# Patient Record
Sex: Male | Born: 1960 | Race: White | Marital: Single | Smoking: Current some day smoker
Health system: Northeastern US, Academic
[De-identification: ages and names within clinical notes are randomized; demographics above are authoritative.]

## PROBLEM LIST (undated history)

## (undated) HISTORY — PX: LIPOMA RESECTION: SHX23

---

## 2011-10-25 ENCOUNTER — Emergency Department: Admit: 2011-10-25 | Disposition: A | Discharge status: Home / Self Care | Payer: Self-pay | Source: Ambulatory Visit

## 2011-10-25 ENCOUNTER — Encounter: Payer: Self-pay | Admitting: Surgical Oncology

## 2011-10-25 LAB — COMPREHENSIVE METABOLIC PANEL
ALT: 23 U/L (ref 0–50)
AST: 18 U/L (ref 0–50)
Albumin: 4.2 g/dL (ref 3.5–5.2)
Alk Phos: 61 U/L (ref 40–130)
Anion Gap: 10 (ref 7–16)
Bilirubin,Total: 0.5 mg/dL (ref 0.0–1.2)
CO2: 25 mmol/L (ref 20–28)
Calcium: 8.7 mg/dL (ref 8.6–10.2)
Chloride: 103 mmol/L (ref 96–108)
Creatinine: 0.74 mg/dL (ref 0.67–1.17)
GFR,Black: 124 *
GFR,Caucasian: 107 *
Globulin: 2.4 g/dL — ABNORMAL LOW (ref 2.7–4.3)
Glucose: 99 mg/dL (ref 60–99)
Lab: 19 mg/dL (ref 6–20)
Potassium: 4 mmol/L (ref 3.3–5.1)
Sodium: 138 mmol/L (ref 133–145)
Total Protein: 6.6 g/dL (ref 6.3–7.7)

## 2011-10-25 LAB — CBC AND DIFFERENTIAL
Baso # K/uL: 0 10*3/uL (ref 0.0–0.1)
Basophil %: 0.2 % (ref 0.2–1.2)
Eos # K/uL: 0.1 10*3/uL (ref 0.0–0.5)
Eosinophil %: 0.7 % — ABNORMAL LOW (ref 0.8–7.0)
Hematocrit: 41 % (ref 40–51)
Hemoglobin: 13.9 g/dL (ref 13.7–17.5)
Lymph # K/uL: 1.8 10*3/uL (ref 1.3–3.6)
Lymphocyte %: 14.1 % — ABNORMAL LOW (ref 21.8–53.1)
MCV: 92 fL (ref 79–92)
Mono # K/uL: 1 10*3/uL — ABNORMAL HIGH (ref 0.3–0.8)
Monocyte %: 7.5 % (ref 5.3–12.2)
Neut # K/uL: 9.9 10*3/uL — ABNORMAL HIGH (ref 1.8–5.4)
Platelets: 235 10*3/uL (ref 150–330)
RBC: 4.4 MIL/uL — ABNORMAL LOW (ref 4.6–6.1)
RDW: 12.5 % (ref 11.6–14.4)
Seg Neut %: 77.2 % — ABNORMAL HIGH (ref 34.0–67.9)
WBC: 12.9 10*3/uL — ABNORMAL HIGH (ref 4.2–9.1)

## 2011-10-25 MED ORDER — CIPROFLOXACIN HCL 500 MG PO TABS *I*
500.0000 mg | ORAL_TABLET | Freq: Once | ORAL | Status: AC
Start: 2011-10-26 — End: 2011-10-26
  Administered 2011-10-26: 500 mg via ORAL
  Filled 2011-10-25: qty 1

## 2011-10-25 MED ORDER — METRONIDAZOLE 250 MG PO TABS *I*
500.0000 mg | ORAL_TABLET | Freq: Once | ORAL | Status: AC
Start: 2011-10-26 — End: 2011-10-26
  Administered 2011-10-26: 500 mg via ORAL
  Filled 2011-10-25: qty 2

## 2011-10-25 MED ORDER — CIPROFLOXACIN HCL 500 MG PO TABS *I*
500.0000 mg | ORAL_TABLET | Freq: Two times a day (BID) | ORAL | Status: AC
Start: 2011-10-25 — End: 2011-11-01

## 2011-10-25 MED ORDER — METRONIDAZOLE 500 MG PO TABS *I*
500.0000 mg | ORAL_TABLET | Freq: Three times a day (TID) | ORAL | Status: AC
Start: 2011-10-25 — End: 2011-11-04

## 2011-10-25 MED ORDER — ONDANSETRON HCL 2 MG/ML IV SOLN *I*
4.0000 mg | Freq: Once | INTRAMUSCULAR | Status: AC
Start: 2011-10-25 — End: 2011-10-25
  Administered 2011-10-25: 4 mg via INTRAVENOUS
  Filled 2011-10-25: qty 2

## 2011-10-25 MED ORDER — HYDROMORPHONE HCL PF 1 MG/ML IJ SOLN *WRAPPED*
1.0000 mg | Freq: Once | INTRAMUSCULAR | Status: AC
Start: 2011-10-25 — End: 2011-10-25
  Administered 2011-10-25: 1 mg via INTRAVENOUS
  Filled 2011-10-25: qty 1

## 2011-10-25 MED ORDER — ONDANSETRON 4 MG PO TBDP *I*
4.0000 mg | ORAL_TABLET | Freq: Three times a day (TID) | ORAL | Status: DC | PRN
Start: 2011-10-25 — End: 2012-04-23

## 2011-10-25 MED ORDER — SODIUM CHLORIDE 0.9 % IV SOLN WRAPPED *I*
125.0000 mL/h | Freq: Once | Status: AC
Start: 2011-10-25 — End: 2011-10-25
  Administered 2011-10-25: 125 mL/h via INTRAVENOUS

## 2011-10-25 MED ORDER — HYDROCODONE-ACETAMINOPHEN 5-325 MG PO TABS *I*
1.0000 | ORAL_TABLET | Freq: Four times a day (QID) | ORAL | Status: AC | PRN
Start: 2011-10-25 — End: 2011-11-24

## 2011-10-25 NOTE — ED Notes (Signed)
Pt started on oral contrast, will monitor tolerance

## 2011-10-25 NOTE — Discharge Instructions (Signed)
Please establish a primary care physician. Please call the new patient phone line at [facility] at [phone].   Take Cipro and flagyl as directed.   Take Zofran for nausea  Take Norco for uncontrolled pain.   Return to the Emergency Department if your condition worsens.

## 2011-10-25 NOTE — ED Provider Notes (Signed)
History     Chief Complaint   Patient presents with   . Abdominal Pain     pt c/o left lower back and lower abdominal pain since monday, with nausea, fatigue, and lightheaded     HPI Comments: 51 yo male presents to the ER with abdominal pain x 4 days. States pain originally started in his lower back and now is mostly in his abdomen. States pain is worse with movement.  + nausea. No fevers or chills.  Denies any hx of intraabdominal pathology such as, IBS, crohn's, colitis, diverticulitis. Has poor appetite from the pain. Denies any changes in his BMs.    The history is provided by the patient.       History reviewed. No pertinent past medical history.         History reviewed. No pertinent past surgical history.    History reviewed. No pertinent family history.      Social History      reports that he has never smoked. He does not have any smokeless tobacco history on file. His alcohol, drug, and sexual activity histories not on file.    Living Situation    Questions Responses    Patient lives with     Homeless     Caregiver for other family member     External Services     Employment Employed    Domestic Violence Risk           Review of Systems   Review of Systems   Constitutional: Positive for appetite change and fatigue. Negative for fever and chills.   Respiratory: Negative.    Cardiovascular: Negative.    Gastrointestinal: Positive for nausea and abdominal pain. Negative for vomiting and diarrhea.   Skin: Negative.    All other systems reviewed and are negative.        Physical Exam     ED Triage Vitals   BP Heart Rate Resp Temp Temp src SpO2 O2 Device O2 Flow Rate weight   10/25/11 1923 10/25/11 1923 10/25/11 1923 10/25/11 1923 -- 10/25/11 1923 10/25/11 1923 -- 10/25/11 1923   146/98 mmHg 82  16  37.6 C (99.7 F)  97 % None (Room air)  72.576 kg (160 lb)       Physical Exam   Nursing note and vitals reviewed.  Constitutional: He is oriented to person, place, and time. He appears well-developed and  well-nourished.   Cardiovascular: Normal rate, regular rhythm and normal heart sounds.    Pulmonary/Chest: Effort normal.   Abdominal: Soft. Bowel sounds are normal. There is tenderness (LLQ pain with palpation. No palpable masses.  no distension.). There is rebound.   Musculoskeletal: Normal range of motion.   Neurological: He is alert and oriented to person, place, and time.   Psychiatric: He has a normal mood and affect.       Medical Decision Making      Amount and/or Complexity of Data Reviewed  Clinical lab tests: ordered  Tests in the radiology section of CPT: ordered  Tests in the medicine section of CPT: ordered  Review and summarize past medical records: yes        Initial Evaluation:  ED First Provider Contact    Date/Time Event User Comments    10/25/11 2106 ED Provider First Contact MCGRUDER, Marquail Bradwell L Initial Face to Face Provider Contact          Patient seen by me as above    Assessment:  51 y.o., male  comes to the ED with abdominal pain x 4 days. Nausea.     Differential Diagnosis includes diverticulitis, colitis, kidney stone.             Plan: UA, CBC, CMP  CT abdomen and pelvis with contrast.  Reassess.    Patient will be transferred to the main ED.       Dub Amis, NP    McGruder, Murlean Iba, NP  10/26/11 1242

## 2011-10-26 NOTE — ED Provider Progress Notes (Signed)
ED Provider Progress Note     Pt signed out by Denny Peon, NP.     Ct scan reviewed:  CT ABDOMEN & PELVIS WITH CONTRAST    (Results Pending)   Diverticulitis seen.     Pt stable, comfortable. Cipro and flagyl ordered.     Pt will be d/c home with PO cipro, flagyl, norco, zofran.     Gilford Rile, PA, 10/26/2011, 12:33 AM

## 2011-11-13 ENCOUNTER — Encounter: Payer: Self-pay | Admitting: Family Medicine

## 2011-11-13 ENCOUNTER — Ambulatory Visit: Payer: Self-pay | Admitting: Family Medicine

## 2011-11-13 VITALS — BP 130/84 | HR 80 | Temp 98.6°F | Ht 64.5 in | Wt 153.0 lb

## 2011-11-13 DIAGNOSIS — Z716 Tobacco abuse counseling: Secondary | ICD-10-CM

## 2011-11-13 DIAGNOSIS — M549 Dorsalgia, unspecified: Secondary | ICD-10-CM

## 2011-11-13 DIAGNOSIS — K5792 Diverticulitis of intestine, part unspecified, without perforation or abscess without bleeding: Secondary | ICD-10-CM | POA: Insufficient documentation

## 2011-11-13 DIAGNOSIS — Z789 Other specified health status: Secondary | ICD-10-CM

## 2011-11-13 DIAGNOSIS — R9389 Abnormal findings on diagnostic imaging of other specified body structures: Secondary | ICD-10-CM

## 2011-11-13 DIAGNOSIS — F17200 Nicotine dependence, unspecified, uncomplicated: Secondary | ICD-10-CM

## 2011-11-13 MED ORDER — NICOTINE POLACRILEX 4 MG MT GUM *I*
4.0000 mg | CHEWING_GUM | OROMUCOSAL | Status: DC | PRN
Start: 2011-11-13 — End: 2012-04-23

## 2011-11-13 NOTE — Progress Notes (Signed)
[facility] - Outpatient Progress Note    SUBJECTIVE    CC: Back pain    HPI:    Back pain started around the time of his diverticulitis, but also increased golf playing. Gradually improving. Wraps around to front, tender to touch. Better w/ heat ice nsaid, worse w/ standing.    Metamucil daily, stools softer. Going 1-2 times per day. Improving diet. Appetite is good. No pain in abdomen since finishing abx.    Trying to quit. Has tried to quit in past. Tried chantix with poor results. Has not tried the patch or the gum. Partner also wants to quit. Interested in the gum.     Drinks 3-4 beers/night. No issues with behavior or other problems or symptoms.        History:      1. Back pain    2. Diverticulitis    3. Abnormal finding on imaging    4. Encounter for smoking cessation counseling        ROS: as per HPI    Patient's medications, allergies, past medical, surgical, social and family histories were reviewed and updated as appropriate.    OBJECTIVE    Filed Vitals:    11/13/11 1500   BP: 130/84   Pulse: 80   Temp: 37 C (98.6 F)   Height: 1.638 m (5' 4.5")   Weight: 69.4 kg (153 lb)       General: Oriented to person, place, and time. Appears well-developed and well-nourished. No distress.   Head: Normocephalic and atraumatic.   Eyes: Conjunctivae and EOM are normal. Pupils are equal, round, and reactive to light.   Neck: Normal range of motion. Neck supple. No thyromegaly present.   Cardiovascular: Normal rate, regular rhythm, normal heart sounds and intact distal pulses.  Exam reveals no gallop and no friction rub.    No murmur heard.  Pulmonary/Chest: Effort normal and breath sounds normal.   Abdominal: Soft. Bowel sounds are normal. There is no tenderness.   Neurological: No gross cranial nerve deficit.   BACK: Nontender to palpation of spine, mild tenderness along obliques c/w sprain  Skin: Skin is warm and dry.   Psychiatric: Normal mood and affect. Behavior is normal. Judgment and thought  content normal.     ASSESSMENT & PLAN    Back pain  A: h/p c/w abdominal muscle sprain.  P: Reassured. Continue supportive care.    Diverticulitis  A: Improved greatly with abx treatment. Taking fiber regularly.   P: to gradually increase fiber intake, will use plenty H2O        1. Back pain     2. Diverticulitis     3. Abnormal finding on imaging  US renal retroperitoneal complete, US abdomen limited single quad, US doppler abdomen complete   4. Encounter for smoking cessation counseling  nicotine polacrilex (NICORETTE) 4 MG gum   5. Smoking addiction     6. Alcohol consumption heavy         Rondel Baton, MD  3:46 PM  11/13/2011      -------------------------------------    Family Medicine Attestation    I have reviewed the history, exam findings, and plan of care and discussed the care plan with the resident at the time of the visit. I agree with the resident's findings and plan of care as documented above.    Silvio Pate, MD  Family Medicine Attending Physician    Signed at 6:02 PM on 11/13/2011.

## 2011-11-13 NOTE — Assessment & Plan Note (Signed)
A: h/p c/w abdominal muscle sprain.  P: Reassured. Continue supportive care.

## 2011-11-13 NOTE — Assessment & Plan Note (Signed)
A: Improved greatly with abx treatment. Taking fiber regularly.   P: to gradually increase fiber intake, will use plenty H2O

## 2011-11-17 DIAGNOSIS — Z0000 Encounter for general adult medical examination without abnormal findings: Secondary | ICD-10-CM

## 2012-04-23 ENCOUNTER — Ambulatory Visit: Payer: Self-pay | Admitting: Family Medicine

## 2012-04-23 ENCOUNTER — Encounter: Payer: Self-pay | Admitting: Family Medicine

## 2012-04-23 ENCOUNTER — Encounter: Payer: Self-pay | Admitting: Gastroenterology

## 2012-04-23 VITALS — BP 144/83 | HR 60 | Temp 97.9°F | Ht 65.14 in | Wt 159.0 lb

## 2012-04-23 DIAGNOSIS — Z0000 Encounter for general adult medical examination without abnormal findings: Secondary | ICD-10-CM

## 2012-04-23 DIAGNOSIS — Z021 Encounter for pre-employment examination: Secondary | ICD-10-CM

## 2012-04-23 DIAGNOSIS — R059 Cough, unspecified: Secondary | ICD-10-CM

## 2012-04-23 LAB — POCT URINALYSIS DIPSTICK
Blood,UA POCT: NEGATIVE
Glucose,UA POCT: NORMAL
Ketones,UA POCT: NEGATIVE
Leuk Esterase,UA POCT: NEGATIVE
Lot #: 22090401
Nitrite,UA POCT: NEGATIVE
PH,UA POCT: 8 (ref 5–8)
Protein,UA POCT: NEGATIVE mg/dL
Specific gravity,UA POCT: 1.01 (ref 1.002–1.03)

## 2012-04-23 NOTE — Progress Notes (Signed)
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   Excited for this opportunity. Also works Holiday representative, just resided a house, replaced windows and roof.     --PMH  Denies  --PSH    Denies  FAMILY HX:  --Mother: alive, no health problems.   --Father: deceased: heavy smoker, ? Lung CA  --Siblings: sister: good health, brother: drug addiction, mental health issues.   --cancers: Dad with lung cancer. No other's that he is aware of.   --DM/Thyroid: denies  --CAD: denies  --Renal: denies  --hematology:denies  --Mental Health: brother with mental illness.      SOCIAL HX:  --Marital status: divorced. Living with girlfriend of 2 years.   --Cohabitants: girl friend and her 2 children  --Children: 2 grown children.   --Education:HS. Some college,    --Recent life stressors: denies  --exercise ; explain:  Works out and job is physical  --Tobacco:   Quit 6 months ago!   --Smoke in home: denies  --Alcohol: y about a 12 pack a week.   --Support systems/coping skills:  good     SEXUAL HISTORY:   --Opposite sex: monogamous.   --# of partners in last  2 years: 1   --STD history: denies  --Contraceptive method: condom/btl     SAFETY:  --wears seatbelt Y  --smoke detectors present Y  --uses sunscreen Y  --housing is adequate Y  --history of domestic violence N  --occupation and risks: construction: lacerations, contusions, trauma.      CONSTITUTIONAL: Appetite good, no fevers, night sweats or weight loss  CV: No chest pain, shortness of breath or peripheral edema  RESPIRATORY: No, wheezing or dyspnea. Occasional cough.    GI: No nausea/vomiting, abdominal pain, or change in bowel habits  GU: No dysuria, urgency or incontinence  NEURO: No MS changes, no motor weakness, no sensory changes     GENERAL APPEARANCE: Appears stated age, well appearing, NAD  HEENT: PERRL, EOMI, TMs normal, oropharynx clear  NECK:Supple, no  lymphadenopathy, thyroid symmetrical with no lesions or nodules apprecaited.   LUNGS: Clear to auscultation and percussion  HEART: Normal S1,S2 without murmurs, gallops, or rubs; PMI not displaced  ABDOMEN: NABS, soft, non-tender, without hepato-splenomegaly  EXTREMITIES: Without clubbing, cyanosis, or edema  NEUROLOGIC: Alert and oriented x3, cranial nerves II-XII intact, motor/sensory exam normal, DTRs symmetric, normal gait     ASSESSMENT:    --Well -- 51 year old  --Diagnoses:    --Abnormal imaging: seen in June/july for follow up ED visit: advised to f/u with [place] for liver lesions: has not had done: states he will have done when he gets back from cruise.   --Preventative: discussed colonoscopy: declines at this time: leaving for 4 month cruise 05/15/2011. Will f/u when he returns.  Will have cxray done for occassional cough and father's history of lung ca as well as his own tobacco history.      PLAN:     1 to 3 years.   --Immunizations: tetanus and Pneumovax today.   Labs:    Needs updated cbc, cmp, MMR titer, Hep A and B titers, HIV and RPR for new job as well as annual health screening.   EKG with sinus brady. Athletic in and out of work.      Other:  anticipatory guidance: safe sexual practices, accident prevention, heart healthy habits.     Form started. Will complete when  labs returned.   Patient keeping form and to bring back in when labs/CKRay  completed

## 2012-04-24 ENCOUNTER — Ambulatory Visit
Admit: 2012-04-24 | Discharge: 2012-04-24 | Disposition: A | Discharge status: Home / Self Care | Payer: Self-pay | Source: Ambulatory Visit | Attending: Family Medicine | Admitting: Family Medicine

## 2012-04-24 ENCOUNTER — Ambulatory Visit
Admit: 2012-04-24 | Discharge: 2012-04-24 | Disposition: A | Discharge status: Home / Self Care | Payer: Self-pay | Source: Ambulatory Visit | Attending: Primary Care | Admitting: Primary Care

## 2012-04-24 DIAGNOSIS — Z0000 Encounter for general adult medical examination without abnormal findings: Secondary | ICD-10-CM

## 2012-04-24 LAB — COMPREHENSIVE METABOLIC PANEL
ALT: 32 U/L (ref 0–50)
AST: 27 U/L (ref 0–50)
Albumin: 5 g/dL (ref 3.5–5.2)
Alk Phos: 67 U/L (ref 40–130)
Anion Gap: 10 (ref 7–16)
Bilirubin,Total: 0.9 mg/dL (ref 0.0–1.2)
CO2: 30 mmol/L — ABNORMAL HIGH (ref 20–28)
Calcium: 9.1 mg/dL (ref 8.6–10.2)
Chloride: 100 mmol/L (ref 96–108)
Creatinine: 0.83 mg/dL (ref 0.67–1.17)
GFR,Black: 117 *
GFR,Caucasian: 102 *
Globulin: 2.6 g/dL — ABNORMAL LOW (ref 2.7–4.3)
Glucose: 99 mg/dL (ref 60–99)
Lab: 15 mg/dL (ref 6–20)
Potassium: 4.5 mmol/L (ref 3.3–5.1)
Sodium: 140 mmol/L (ref 133–145)
Total Protein: 7.6 g/dL (ref 6.3–7.7)

## 2012-04-24 LAB — CBC
Hematocrit: 45 % (ref 40–51)
Hemoglobin: 15.4 g/dL (ref 13.7–17.5)
MCV: 93 fL — ABNORMAL HIGH (ref 79–92)
Platelets: 301 10*3/uL (ref 150–330)
RBC: 4.9 MIL/uL (ref 4.6–6.1)
RDW: 12.7 % (ref 11.6–14.4)
WBC: 9.9 10*3/uL — ABNORMAL HIGH (ref 4.2–9.1)

## 2012-04-24 LAB — LIPID PANEL
Chol/HDL Ratio: 4.3 (ref 0.0–5.0)
Cholesterol: 269 mg/dL — AB
HDL: 63 mg/dL
LDL Calculated: 145 mg/dL
Non HDL Cholesterol: 206 mg/dL
Triglycerides: 305 mg/dL — AB

## 2012-04-25 LAB — HEPATITIS B SURFACE ANTIGEN: HBV S Ag: NEGATIVE

## 2012-04-25 LAB — SYPHILIS SCREEN
Syphilis Screen: NEGATIVE
Syphilis Status: NONREACTIVE

## 2012-04-25 LAB — HIV 1&2 ANTIGEN/ANTIBODY: HIV 1&2 ANTIGEN/ANTIBODY: NONREACTIVE

## 2012-04-25 LAB — MEASLES IGG AB: Measles IgG: POSITIVE

## 2012-04-25 LAB — HEPATITIS A ANTIBODY, IGM: Hep A IgM: NEGATIVE

## 2012-04-26 ENCOUNTER — Ambulatory Visit: Payer: Self-pay

## 2012-04-26 ENCOUNTER — Other Ambulatory Visit: Payer: Self-pay | Admitting: Family Medicine

## 2012-04-26 DIAGNOSIS — Z0000 Encounter for general adult medical examination without abnormal findings: Secondary | ICD-10-CM

## 2012-04-26 LAB — MUMPS ANTIBODY, IGG: Mumps IgG: POSITIVE

## 2012-04-26 LAB — RUBELLA ANTIBODY, IGG: Rubella IgG AB: POSITIVE

## 2013-06-04 ENCOUNTER — Emergency Department: Admit: 2013-06-04 | Disposition: A | Discharge status: Home / Self Care | Payer: Self-pay | Source: Ambulatory Visit

## 2013-06-04 ENCOUNTER — Encounter: Payer: Self-pay | Admitting: Pulmonary Disease

## 2013-06-04 MED ORDER — ONDANSETRON 4 MG PO TBDP *I*
4.0000 mg | ORAL_TABLET | Freq: Once | ORAL | Status: AC
Start: 2013-06-04 — End: 2013-06-04
  Administered 2013-06-04: 4 mg via ORAL
  Filled 2013-06-04: qty 1

## 2013-06-04 MED ORDER — CEPHALEXIN 500 MG PO CAPS *I*
500.0000 mg | ORAL_CAPSULE | Freq: Four times a day (QID) | ORAL | Status: AC
Start: 2013-06-04 — End: 2013-06-11

## 2013-06-04 MED ORDER — OXYCODONE-ACETAMINOPHEN 2.5-325 MG PO TABS *A*
1.0000 | ORAL_TABLET | Freq: Four times a day (QID) | ORAL | Status: DC | PRN
Start: 2013-06-04 — End: 2015-11-22

## 2013-06-04 MED ORDER — OXYCODONE-ACETAMINOPHEN 5-325 MG PO TABS *I*
2.0000 | ORAL_TABLET | Freq: Once | ORAL | Status: AC
Start: 2013-06-04 — End: 2013-06-04
  Administered 2013-06-04: 2 via ORAL
  Filled 2013-06-04: qty 2

## 2013-06-04 MED ORDER — CEPHALEXIN 250 MG PO CAPS *I*
500.0000 mg | ORAL_CAPSULE | Freq: Once | ORAL | Status: AC
Start: 2013-06-04 — End: 2013-06-04
  Administered 2013-06-04: 500 mg via ORAL
  Filled 2013-06-04: qty 2

## 2013-06-04 NOTE — ED Procedure Documentation (Signed)
Procedures   Laceration repair  Date/Time: 06/04/2013 10:00 AM  Performed by: Jackquline Berlin  Authorized by: Jackquline Berlin  Consent: Verbal consent obtained.  Risks and benefits: risks, benefits and alternatives were discussed  Consent given by: patient  Patient understanding: patient states understanding of the procedure being performed  Patient consent: the patient's understanding of the procedure matches consent given  Procedure consent: procedure consent matches procedure scheduled  Required items: required blood products, implants, devices, and special equipment available  Patient identity confirmed: verbally with patient  Body area: lower extremity  Location details: left upper leg  Laceration length: 8 cm  Contamination: The wound is contaminated. (shreds of denim and shards of metal)  Tendon involvement: none  Nerve involvement: none  Vascular damage: no  Anesthesia: local infiltration  Local anesthetic: lidocaine 1% with epinephrine  Anesthetic total: 10 ml  Patient sedated: no  Preparation: Patient was prepped and draped in the usual sterile fashion.  Irrigation solution: saline  Irrigation method: syringe  Amount of cleaning: extensive  Skin closure: staples  Number of sutures: 8 (staples)  Approximation: close  Dressing: 4x4 sterile gauze and gauze roll  Patient tolerance: Patient tolerated the procedure well with no immediate complications.  Comments: Wrapped with ace wrap.         Jackquline Berlin, PA

## 2013-06-04 NOTE — ED Notes (Signed)
Pt states he cut his L upper leg with grinding wheel. Bleeding controlled at triage  Last TD last year

## 2013-06-04 NOTE — Discharge Instructions (Signed)
Please take full course of antibiotic(s) (keflex) as directed. You received your first dose of this medication in the Emergency Department, so start this medication in 6 hours  Please use percocet for severe pain. Use as prescribed. Do not drink alcohol, drive, or operate machinery while on this medication. Do not take additional Tylenol or products containing acetaminophen while on this medication. Take with food to prevent stomach upset/nausea/vomiting.  Please leave bandage in place for next 24-48 hours. After that, remove the bandage. Clean wound daily with soap/water. Pat dry gently. Keep wound covered if still oozing blood. Keep wound covered if likely to get dirty. Do not immerse wound in water for prolonged periods (i.e. swimming, bath, etc.) until completely healed. Follow up with your primary care physician in 2 days for wound check and in 10-14 days for staple removal. If you do not have a primary care physician, you can return to the Emergency Department for staple removal  Watch for signs of infection: increased redness, pain, pus, swelling, fever and seek care if noted

## 2013-06-04 NOTE — ED Provider Notes (Signed)
History     Chief Complaint   Patient presents with    Leg Injury     HPI Comments: Laceration    Cut left thigh about 1 hour prior to arrival on a grinding wheel. Bleeding controlled. Tetanus up to date. Able to ambulate without difficulty.  No weakness, numbness, tingling. Denies other injury. Did not get a chance to wash wound prior to arrival.      History provided by:  Patient      History reviewed. No pertinent past medical history.         Past Surgical History   Procedure Laterality Date    Lipoma resection         Family History   Problem Relation Age of Onset    Cancer Father          Social History      reports that he has quit smoking. His smoking use included Cigarettes. He smoked 1.00 pack per day. He does not have any smokeless tobacco history on file. He reports that he drinks about 16.8 ounces of alcohol per week. His drug and sexual activity histories are not on file.    Living Situation    Questions Responses    Patient lives with     Homeless     Caregiver for other family member     External Services     Employment Employed    Domestic Violence Risk           Problem List     Patient Active Problem List   Diagnosis Code    Back pain 724.5    Diverticulitis 562.11    Smoking addiction 305.1    Alcohol consumption heavy 305.00    Health care maintenance V70.0       Review of Systems   Review of Systems   Constitutional: Negative for fever and chills.   Respiratory: Negative for shortness of breath.    Cardiovascular: Negative for chest pain.   Gastrointestinal: Negative for abdominal pain.   Musculoskeletal: Negative for arthralgias and gait problem.   Skin: Positive for wound.   Neurological: Negative for syncope, weakness and numbness.       Physical Exam     ED Triage Vitals   BP Pulse Heart Rate(via Pulse Ox) Resp Temp Temp Source SpO2 O2 Device O2 Flow Rate   06/04/13 0942 -- 06/04/13 0935 06/04/13 0935 06/04/13 0935 06/04/13 0935 06/04/13 0935 06/04/13 0935 --   161/96 mmHg  73  18 36 C (96.8 F) TEMPORAL 100 % None (Room air)       Weight           06/04/13 0935           72.576 kg (160 lb)               Physical Exam   Constitutional: He is oriented to person, place, and time. He appears well-developed and well-nourished.   HENT:   Head: Normocephalic and atraumatic.   Pulmonary/Chest: Effort normal.   Musculoskeletal:        Left hip: Normal.        Left knee: Normal.        Left upper leg: He exhibits laceration.        Legs:  8 cm Full thickness irregular vertical laceration , + multiple pieces of debris in wound, bleeding controlled.   Quad intact  Full strength, leg neuro vasc intact   Neurological: He is alert and  oriented to person, place, and time.   Psychiatric: He has a normal mood and affect.       Medical Decision Making   <EDMDM>    Initial Evaluation:  ED First Provider Contact    Date/Time Event User Comments    06/04/13 [phone] ED Provider First Contact Warden Fillers, Collan Schoenfeld A Initial Face to Face Provider Contact          Patient seen by me as above    Assessment:  53 y.o., male comes to the ED with laceration    Differential Diagnosis includes laceration              Plan: wound care, analgesia, po abx, follow up with pcp in 2 days for wound check and in 10- 14 days for staple removal    Medications   oxyCODONE-acetaminophen (PERCOCET) 5-325 MG per tablet 2 tablet (2 tablets Oral Given 06/04/13 0952)   ondansetron (ZOFRAN-ODT) disintegrating tablet 4 mg (4 mg Oral Given 06/04/13 0952)   cephalexin (KEFLEX) capsule 500 mg (500 mg Oral Given 06/04/13 1113)       See procedure note for wound closure      Jackquline Berlin, PA    Supervising physician Dr Fredricka Bonine was immediately available      Jackquline Berlin, [place]  06/04/13 1542

## 2013-10-27 ENCOUNTER — Ambulatory Visit: Payer: Self-pay | Admitting: Family Medicine

## 2013-10-27 ENCOUNTER — Encounter: Payer: Self-pay | Admitting: Family Medicine

## 2013-10-27 VITALS — BP 128/95 | HR 72 | Temp 98.6°F | Ht 64.96 in | Wt 156.0 lb

## 2013-10-27 DIAGNOSIS — F17200 Nicotine dependence, unspecified, uncomplicated: Secondary | ICD-10-CM

## 2013-10-27 DIAGNOSIS — R0989 Other specified symptoms and signs involving the circulatory and respiratory systems: Secondary | ICD-10-CM

## 2013-10-27 DIAGNOSIS — Z789 Other specified health status: Secondary | ICD-10-CM

## 2013-10-27 DIAGNOSIS — I10 Essential (primary) hypertension: Secondary | ICD-10-CM

## 2013-10-27 DIAGNOSIS — B079 Viral wart, unspecified: Secondary | ICD-10-CM

## 2013-10-27 DIAGNOSIS — M542 Cervicalgia: Secondary | ICD-10-CM

## 2013-10-27 MED ORDER — VARENICLINE TARTRATE 0.5 MG X 11 & 1 MG X 42 PO MISC *A*
ORAL | Status: DC
Start: 2013-10-27 — End: 2015-11-22

## 2013-10-27 NOTE — Progress Notes (Signed)
[facility] - Outpatient Progress Note    SUBJECTIVE    CC:   Chief Complaint   Patient presents with    Neck Pain       HPI:    Throat:  Globus sensation, swallowing food and drink just fine. Not painful. Some stress lately.  .    Head:Has a small bump on scalp, present for several months. Not painful or itchy, not sure what it is. Never had anything like it before. No lymphadenopathy. No injury to area.     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 Full ROM. Sometimes his hands get numb when working above his head. No weakness or dropping things. No injuries. Has tried ibuprofen.       History:        ICD-9-CM ICD-10-CM   1. Globus sensation 306.4 F45.8   2. Smoking addiction 305.1 F17.200   3. Neck ache 723.1 M54.2   4. Wart 078.10 B07.9   5. Alcohol consumption heavy 305.00 F10.10   6. BP (high blood pressure) 401.9 I10       ROS: as per HPI    Patient's medications, allergies, past medical, surgical, social and family histories were reviewed and updated as appropriate.    OBJECTIVE    Filed Vitals:    10/27/13 0917   BP: 128/95   Pulse: 72   Temp: 37 C (98.6 F)   Height: 1.65 m (5' 4.96")   Weight: 70.761 kg (156 lb)     General: Oriented to person, place, and time. Appears well-developed and well-nourished. No distress.   thyroid normal size without nodules. Oropharynx normal. No masses noted in neck .   Eyes: Conjunctivae,external lids, and EOM are normal. Pupils are equal, round.   Scalp: Small flesh colored verrucous papule noted within hair.   Neck: R neck is tender along the trapezius muscle. ROM in neck is good with only slight decrease in flexion to L side c/w tight muscle. There is a 5 cm, flattened lipoma with a well healed scar from a previous attempt at removal overlying it. It is in the area of the tenderness, but the lipoma itself does not seem tender.   Psychiatric: Normal mood and affect. Behavior is normal. Judgment and thought content normal.     ASSESSMENT &  PLAN  1. Globus sensation  Sensation of something present in throat. No symptoms of actual obstruction, not painful. Drinker and smoker. Suspect functional problem, he has no evidence of actual obstruction or stenosis. IF this problem persists he will likely be scoped eventually, but if the problem is related to his acute stressors it may yet resolve.     2. Smoking addiction  Feels very ready to quit. Thinks one more cycle of chantix would do it.   -Chantix  3. Neck ache  Pain in R neck. Possible element of torticollis. Lipoma is present in the area but seems uninvolved. Recommended heat, ice, stretching, NSAIDS.     4. Wart  Diagnosis given, treatment offered and declined.     5. Alcohol consumption heavy  States he needs to cut back badly but cigarettes are his first priority.     6. BP (high blood pressure)  Will follow up with new CCP to address blood pressure, wanted to make sure it was a legitimate issue with retest but chart review seems to show it is a real problem. Likely related to over consumption of alcohol, this was discussed with patient.   -  Comprehensive metabolic panel; Future  - CBC and differential; Future  - LIPID PANEL; Future  - Hepatitis C antibody; Future  - Hemoglobin A1c; Future      Jeannie Fend, MD  3:16 PM  10/27/2013      -------------------------------------      --------------------    Preceptor Attestation - Patient Seen    I saw and evaluated the patient. I agree with the resident's/fellow's findings and plan of care as documented above.         HOLLY ANN RUSSELL, MD

## 2013-11-21 ENCOUNTER — Ambulatory Visit: Payer: Self-pay | Admitting: Family Medicine

## 2013-11-21 ENCOUNTER — Encounter: Payer: Self-pay | Admitting: Family Medicine

## 2015-11-20 ENCOUNTER — Ambulatory Visit: Payer: Self-pay | Admitting: Primary Care

## 2015-11-22 ENCOUNTER — Ambulatory Visit: Payer: Self-pay | Attending: Internal Medicine | Admitting: General Surgery

## 2015-11-22 ENCOUNTER — Encounter: Payer: Self-pay | Admitting: General Surgery

## 2015-11-22 VITALS — BP 134/95 | HR 74 | Temp 98.1°F | Ht 65.0 in | Wt 158.0 lb

## 2015-11-22 DIAGNOSIS — Z5989 Other problems related to housing and economic circumstances: Secondary | ICD-10-CM

## 2015-11-22 DIAGNOSIS — D179 Benign lipomatous neoplasm, unspecified: Secondary | ICD-10-CM

## 2015-11-22 DIAGNOSIS — R1031 Right lower quadrant pain: Secondary | ICD-10-CM

## 2015-11-22 NOTE — Progress Notes (Signed)
[facility] - Outpatient Progress Note    SUBJECTIVE    Carl Massey is a 55 y.o. male who presents for Abdominal Pain (pt c/o ABD pain X2weeks w/diarrhea )    Abdominal Pain with Diarrhea  Started 2 weeks ago.   Ate something (can't remember what) and felt like he was bloated for about 2 days.   Then had diarrhea.   Has had RLQ pain since then.  Pain is constant, not better or worse with eating or BM.    Having soft stools, feels like he is "taking in" more than he is "putting out".   No sick contacts.   Has had diverticulitis. Previously more centralized, now on the right side.   No fever, n/v, blood in stool.     CT Abdomen/Pelvis 10/25/15 - 1. Acute diverticulitis without evidence for abscess formation or perforation. 2. Multiple low-density liver lesions with peripheral nodular enhancement, largest measuring 2.7 x 2.5 cm in the right lobe. These have a typical appearance for hepatic hemangiomas, however other etiologies are not excluded. Ultrasound is recommended for further evaluation when clinically appropriate. 3. 4.4 cm low-density lesion in the lower pole of left kidney which may represent a complex cyst. Followup ultrasound recommended.    Lipoma  Reports history of lipoma removal from right posterior shoulder.   Lipoma has since returned and is much larger than before.   Desires removal.   Reports that he often rests ladders on this spot while working, wonders if that may be contributing to growth of lipoma.     Insurance Coverage Problems  Does not currently have insurance.     Patient's medications, allergies, past medical, surgical, social and family histories were reviewed and updated as appropriate.    Review of Systems   Constitutional: Negative for fever and chills.   Gastrointestinal: Negative for n/v.     OBJECTIVE    BP (!) 134/95 (BP Location: Right arm, Patient Position: Sitting, Cuff Size: adult)  Pulse 74  Temp 36.7 C (98.1 F) (Temporal)   Ht 1.651 m (5\' 5" ) Comment: tramns   Wt 71.7 kg (158 lb)  BMI 26.29 kg/m2    VITALS: reviewed  GENERAL: Appears stated age, well appearing, NAD. Pleasant and cooperative.   LUNGS: Clear to auscultation bilaterally. Breathing even and unlabored.   HEART: Normal rate, regular rhythm, with normal S1 & S2.  No murmurs, gallops, or rubs.  ABDOMEN: Normoactive BS, soft, non-tender, without organomegaly.   SKIN: Soft, non-mobile, non-tender mass of right posterior shoulder measuring approximately 4.5 cm in diameter and 1.5 cm high.     ASSESSMENT & PLAN    1. RLQ abdominal pain  - Acute.   - No concerning findings on exam.   - Location not consistent with typical presentation of diverticulitis. Given gradually improving symptoms, no fever, and benign exam, imaging not indicated.   - Will refer to GI. Patient is overdue for screening colonoscopy, which could also be diagnostic for cause of current abdominal symptoms.   - AMB REFERRAL TO GASTROENTEROLOGY    2. Lipoma  - New Problem.   - Given size, will refer to general surgery once insurance coverage is obtained.     3. Insurance coverage problems  - [place] with patient. He will complete application.   - To schedule appointment with insurance enroller at [facility] to further explore insurance options.     Will need full physical and labs once insurance is active.     Follow  up in 2 weeks.  Precautions reviewed.    Jeralyn Ruths, FNP-C

## 2015-11-30 ENCOUNTER — Ambulatory Visit: Payer: Self-pay | Attending: Family Medicine | Admitting: Family Medicine

## 2015-11-30 ENCOUNTER — Telehealth: Payer: Self-pay | Admitting: Family Medicine

## 2015-11-30 ENCOUNTER — Encounter: Payer: Self-pay | Admitting: Family Medicine

## 2015-11-30 VITALS — BP 126/91 | HR 63 | Temp 97.4°F | Ht 65.35 in | Wt 164.0 lb

## 2015-11-30 DIAGNOSIS — M549 Dorsalgia, unspecified: Secondary | ICD-10-CM

## 2015-11-30 MED ORDER — CYCLOBENZAPRINE HCL 10 MG PO TABS *I*
10.0000 mg | ORAL_TABLET | Freq: Every evening | ORAL | 1 refills | Status: DC | PRN
Start: 2015-11-30 — End: 2015-12-17

## 2015-11-30 MED ORDER — NAPROXEN 500 MG PO TABS *I*
500.0000 mg | ORAL_TABLET | Freq: Two times a day (BID) | ORAL | 0 refills | Status: AC
Start: 2015-11-30 — End: 2015-12-14

## 2015-11-30 NOTE — Telephone Encounter (Signed)
Pt was referred to have and colonoscopy with [facility], he is asking to be referred to the [facility] location only because they can get him in sooner.

## 2015-11-30 NOTE — Assessment & Plan Note (Addendum)
Pain in the lower right back that has been hurting for 2 weeks.  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  Pain comes and goes, it will sometimes keep him up all night. Took some ibuprofen this morning which he says has helped with the pain.  Bowel movents have not become regular since last week.  Pt carried a king sized mattress up the stairs around the time pain started, denies pain shooting down his leg.

## 2015-11-30 NOTE — Progress Notes (Signed)
[place] - Outpatient Progress Note    SUBJECTIVE    Back pain  Pain in the lower right back that has been hurting for 2 weeks.  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  Pain comes and goes, it will sometimes keep him up all night. Took some ibuprofen this morning which he says has helped with the pain.  Bowel movents have not become regular since last week.  Pt carried a king sized mattress up the stairs around the time pain started, denies pain shooting down his leg.    Had been having some right sided abdominal pain that started these epsiode.  Abdominal pain has improved; still awaiting referral to GI.  His insurance does not start until 9/1 and he wants to wait to have a lot of testing and appt until after this time if possible.    ROS: see above    Patient's medications, allergies, past medical, surgical, social and family histories were reviewed and updated as appropriate.      OBJECTIVE    Vitals:    11/30/15 1638   BP: (!) 126/91   Pulse: 63   Temp: 36.3 C (97.4 F)   Weight: 74.4 kg (164 lb)   Height: 1.66 m (5' 5.35")       General: No acute distress  CV: Regular rate and rhythm, normal S1-S2  Lungs: Clear to auscultation  Extremities: No clubbing cyanosis or edema  Abs: soft, NT, ND, + BS  Back: tenderness on palpation in the paraspinal region; negative straight leg raise    PHQ-2/9 11/17/2011   PHQ-9 Total Score (Manual Entry) 0       RESULTS: Reviewed previous notes and plan    ASSESSMENT & PLAN    1. Back pain, unspecified back location, unspecified back pain laterality, unspecified chronicity  etiology appears to be some muscle strain in lower lumbar musculature.  Recommended proceeding with scheduling of GI appt and to schedule colonoscopy.  For ongoing back pain and tenderness to try antinflammatory.  Prescribed naproxen twice a day. Also to try muscle relaxer at night as needed. Advised against driving or drinking any alcohol while taking muscle relaxer. Encouraged  stretching the painful area to see if it can relieve pain.    To f/u for recheck in 2-3 weeks or sooner if sx change or worsen.       AUTHORS   Attestations:   Eulogio Bear, am scribing for and in the presence of Dr. Garner Nash, DO on 11/30/15 at 4:53 PM.         All medical record entries made by the Scribe were at my direction and personally dictated by me. I have reviewed the chart and agree that the record accurately reflects my personal performance of the history, physical exam, assessment and plan. I have also personally directed, reviewed, and agree with the discharge instructions    [place], DO

## 2015-12-03 ENCOUNTER — Other Ambulatory Visit: Payer: Self-pay | Admitting: Family Medicine

## 2015-12-03 DIAGNOSIS — R109 Unspecified abdominal pain: Secondary | ICD-10-CM

## 2015-12-07 ENCOUNTER — Ambulatory Visit: Payer: Self-pay | Admitting: Family Medicine

## 2015-12-17 ENCOUNTER — Encounter: Payer: Self-pay | Admitting: Primary Care

## 2015-12-17 ENCOUNTER — Ambulatory Visit: Payer: Self-pay | Attending: Geriatric Medicine | Admitting: Primary Care

## 2015-12-17 VITALS — BP 144/97 | HR 62 | Temp 97.9°F | Ht 65.0 in | Wt 164.0 lb

## 2015-12-17 DIAGNOSIS — M25512 Pain in left shoulder: Secondary | ICD-10-CM

## 2015-12-17 MED ORDER — CYCLOBENZAPRINE HCL 10 MG PO TABS *I*
10.0000 mg | ORAL_TABLET | Freq: Three times a day (TID) | ORAL | 0 refills | Status: AC | PRN
Start: 2015-12-17 — End: ?

## 2015-12-17 MED ORDER — NAPROXEN 250 MG PO TABS *I*
250.0000 mg | ORAL_TABLET | Freq: Two times a day (BID) | ORAL | 0 refills | Status: AC
Start: 2015-12-17 — End: ?
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
## 2015-12-23 ENCOUNTER — Telehealth: Payer: Self-pay

## 2015-12-23 NOTE — Telephone Encounter (Addendum)
Diagnosis Information   Diagnosis   R10.9 (ICD-10-CM) - Abdominal pain, unspecified location        Returned call to patient to schedule NPV based on eRecord referral for abdominal pain.  Patient reports the abdominal pain has improved/resolved and needs to be scheduled for a colonoscopy.  Patient answered "no" to all prescreen questions and scheduled COD with Dr. Arrie Eastern on 02/08/16.  Patient requested procedure after 9/1.  Writer will contact PCP directly and request new referral.  Miralax prep instructions sent via Mychart per request.

## 2015-12-23 NOTE — Telephone Encounter (Signed)
Carl Massey needs to be scheduled for a colonoscopy. There are no schedules available.     Was the patient referred to see a particular provider? no. If yes, which Provider?     Please call the patient to schedule at [phone].

## 2015-12-27 ENCOUNTER — Encounter: Payer: Self-pay | Admitting: Pediatrics

## 2015-12-27 ENCOUNTER — Ambulatory Visit: Payer: Self-pay | Attending: Geriatric Medicine | Admitting: Pediatrics

## 2015-12-27 DIAGNOSIS — M546 Pain in thoracic spine: Secondary | ICD-10-CM

## 2015-12-27 DIAGNOSIS — M25512 Pain in left shoulder: Secondary | ICD-10-CM

## 2015-12-27 NOTE — Patient Instructions (Signed)
Active Range of Motion Exercises, Neck and Shoulders    About this topic   Active range or motion or AROM exercises are done when a person moves the muscles without help from another person or device. Using weights or exercise bands for these exercises can make them more difficult.  General   Before starting with a program, ask your doctor if you are healthy enough to do these exercises. Your doctor may have you work with a trainer or physical therapist to make a safe exercise program to meet your needs.  Strengthening Exercises   Strengthening exercises keep your muscles firm and strong. Stand or sit up tall on a chair without arms for your exercises. Start by repeating each exercise 2 to 3 times. Work up to doing each exercise 10 times. Try to do the exercises 2 to 3 times each day. Do all exercises slowly.  · Neck front-to-back motion ? Look down to the floor and then up at the ceiling.  · Neck side-to-side motion ? Tilt your head to the side and bring your ear to your shoulder. Now, tilt your head to the other side.  · Neck circles ? Make a circle with your neck. Start by turning your head to the right, then look up to the ceiling. Look over your left shoulder and return to the starting position. Now, do this in the other direction.  · Shoulder raises and extensions ? With your elbows straight, bring your arms in front of you and over your head. Move them towards the ceiling and then back to your sides. Keep your elbows straight and reach backwards. Return your arms to your sides.  · Shoulder side raises ? With your elbows straight and your thumbs up, bring your arms out to the side in a "T" position. Keep going until your arms are overhead towards the ceiling. Bring your arms back to your sides.  · Shoulder rotations ? Raise your arms out to the sides to shoulder height. Bend your elbows so your fingers are pointing to the ceiling. Move your lower arms forward and down until your fingers are pointing at  the ground. Your upper arms should stay level with your shoulders. Bring your lower arms back up to the ceiling.               What will the results be?   · Less pain and stiffness  · Keep your muscles strong and flexible  · Help you heal faster after an injury or surgery  · Increase blood flow to a body part  · Help you feel better and more relaxed  · Give you more energy  · More toned looking muscles  · Easier to do daily activities  Helpful tips   · Stay active and work out to keep your muscles strong and flexible.  · Be sure you do not hold your breath when exercising. This can raise your blood pressure. If you tend to hold your breath, try counting out loud when exercising. If any exercise bothers you, stop right away.  · Try walking and swinging your arms at an easy pace for a few minutes to warm up your muscles. Do this again after exercising.  · Doing exercises before a meal may be a good way to get into a routine.  · Exercise may be slightly uncomfortable, but you should not have sharp pains. If you do get sharp pains, stop what you are doing. If the sharp pains continue, call   your doctor.  Where can I learn more?   [employer]  http://www.arthritistoday.org/fitness/index.php   Last Reviewed Date   2013-05-11  Consumer Information Use and Disclaimer   This information is not specific medical advice and does not replace information you receive from your health care provider. This is only a brief summary of general information. It does NOT include all information about conditions, illnesses, injuries, tests, procedures, treatments, therapies, discharge instructions or life-style choices that may apply to you. You must talk with your health care provider for complete information about your health and treatment options. This information should not be used to decide whether or not to accept your health care provider’s advice, instructions or recommendations. Only your health care provider has the  knowledge and training to provide advice that is right for you.  Copyright   Copyright © 2015 [employer] and its affiliates and/or licensors. All rights reserved.
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
## 2015-12-31 ENCOUNTER — Telehealth: Payer: Self-pay

## 2015-12-31 NOTE — Telephone Encounter (Signed)
Patient is requesting a phone call from a nurse with the results from his xray, please follow up

## 2016-01-01 ENCOUNTER — Other Ambulatory Visit: Payer: Self-pay | Admitting: Family Medicine

## 2016-01-01 DIAGNOSIS — Z0000 Encounter for general adult medical examination without abnormal findings: Secondary | ICD-10-CM

## 2016-01-11 ENCOUNTER — Encounter: Payer: Self-pay | Admitting: Family Medicine

## 2016-01-11 ENCOUNTER — Ambulatory Visit: Payer: Medicaid (Managed Care) | Attending: Geriatric Medicine | Admitting: Family Medicine

## 2016-01-11 DIAGNOSIS — M549 Dorsalgia, unspecified: Secondary | ICD-10-CM

## 2016-01-11 DIAGNOSIS — M25512 Pain in left shoulder: Secondary | ICD-10-CM
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
## 2016-01-13 ENCOUNTER — Ambulatory Visit: Payer: Self-pay | Admitting: Rehabilitative and Restorative Service Providers"

## 2016-01-13 ENCOUNTER — Encounter: Payer: Self-pay | Admitting: Rehabilitative and Restorative Service Providers"

## 2016-01-13 DIAGNOSIS — M25519 Pain in unspecified shoulder: Secondary | ICD-10-CM

## 2016-01-13 NOTE — Progress Notes (Signed)
Physical Therapy Daily Flowsheet:  *Please see Physical Therapy Exercise Flowsheet for details regarding exercises completed this session.*     01/13/16 0800   Overview   Diagnosis left shoulder pain   Public relations account executive Accident   Script Date 01/13/16  (12/27/15)   Visit # 1   Additional Comments IE. see note   Functional Outcome Measures   Self Reported Functional Measures Yes   Patient Education   Educated in disease process Yes   Educated in home exercise program Yes   Educated in proper postural mechanics and management Yes   Educated in shoulder strengthening exercises Yes   Time Calculation   PT Timed Codes 5   PT Untimed Codes 25   PT Unbilled Time 0   PT Total Treatment 30   Plan and Onset date   Plan of Care Date 01/13/16   Onset Date 12/12/15   Treatment Start Date 01/13/16   Charges   CC Charges PT Eval Low complexity - code 7161     Almyra Free Buerger, PT, DPT
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
## 2016-01-13 NOTE — Progress Notes (Signed)
Physical Therapy Exercise Flowsheet:  *Please refer to Physical Therapy Daily Flowsheet for further details of this session.*     01/13/16 1400   Shoulder Exercises   Shoulder External Rotation, Theraband Comment L3 HEP   Shoulder Flexion, Theraband Comment L3 HEP   Shoulder Extension, Theraband Comment L3 HEP   Shoulder Internal Rotation, Theraband Comment L3 HEP     Ronnette Hila, PT, DPT

## 2016-01-19 ENCOUNTER — Telehealth: Payer: Self-pay

## 2016-01-19 NOTE — Telephone Encounter (Signed)
Left message regarding 10.03.17 appointment. Patient must call back and confirm date/time of appointment, location; directions received, and have transportation. Patient also must  confirm he/she is not a diabetic and/or on blood thinning medications.

## 2016-01-25 ENCOUNTER — Ambulatory Visit: Payer: Self-pay

## 2016-01-25 DIAGNOSIS — M25519 Pain in unspecified shoulder: Secondary | ICD-10-CM

## 2016-01-25 NOTE — Progress Notes (Signed)
Physical Therapy Daily Flowsheet:  *Please see Physical Therapy Exercise Flowsheet for details regarding exercises completed this session.*     01/25/16 1535   Overview   Diagnosis left shoulder pain, RTC sprain   Public relations account executive Accident   Script Date 01/13/16   Visit # 2   Pain Assessment   Pain X   0-10 Scale 4   Pain Location Shoulder   ROM   L Shoulder Flexion (0-170) 135   L Shoulder External Rotation (0-90) 55   Modalities   Modalities No   Patient Education   Educated in disease process Yes   Educated in home exercise program Yes   Educated in proper postural mechanics and management Yes   Educated in shoulder strengthening exercises Yes   Time Calculation   PT Untimed Codes 30 min   PT Unbilled Time 0   PT Total Treatment 30   Charges   CC Charges Therapeutic Exercises - code [phone] (15 minutes x2)   PT Equipment [employer] - code [place], PTA

## 2016-01-25 NOTE — Progress Notes (Signed)
Physical Therapy Exercise Flowsheet:  *Please refer to Physical Therapy Daily Flowsheet for further details of this session.*     01/25/16 1530   Shoulder Exercises   Shoulder Exercises Yes   Passive Shoulder Flexion, ROM, Supine Comment 10x5sec   Passive Shoulder External Rotation, Rom, Supine Comment 10x5sec   Active-Assisted Shoulder Internal Rotation, Standing Comment 10x5sec   Shoulder External Rotation, Theraband Comment level 3, 10x5sec   Shoulder Flexion, Theraband Comment level 3, 10x5sec   Shoulder Extension, Theraband Comment level 3, 10x5sec   Shoulder Internal Rotation, Theraband Comment level 3, 10x5sec   Mahalia Longest, PTA

## 2016-02-01 ENCOUNTER — Ambulatory Visit: Payer: Self-pay

## 2016-02-08 ENCOUNTER — Ambulatory Visit
Admission: RE | Admit: 2016-02-08 | Discharge: 2016-02-08 | Disposition: A | Discharge status: Home / Self Care | Payer: Self-pay | Attending: Gastroenterology | Admitting: Gastroenterology

## 2016-02-08 ENCOUNTER — Encounter: Payer: Self-pay | Admitting: Gastroenterology

## 2016-02-08 MED ORDER — MIDAZOLAM HCL 5 MG/5ML IJ SOLN *I*
INTRAMUSCULAR | Status: AC
Start: 2016-02-08 — End: 2016-02-08
  Filled 2016-02-08: qty 10

## 2016-02-08 MED ORDER — SODIUM CHLORIDE 0.9 % IV SOLN WRAPPED *I*
100.0000 mL/h | Status: DC
Start: 2016-02-08 — End: 2016-02-09
  Administered 2016-02-08: 100 mL/h via INTRAVENOUS

## 2016-02-08 MED ORDER — MEPERIDINE HCL 50 MG/ML IJ SOLN *WRAPPED*
INTRAMUSCULAR | Status: AC | PRN
Start: 2016-02-08 — End: 2016-02-08
  Administered 2016-02-08: 50 mg via INTRAVENOUS

## 2016-02-08 MED ORDER — MIDAZOLAM HCL 5 MG/5ML IJ SOLN *I*
INTRAMUSCULAR | Status: AC | PRN
Start: 2016-02-08 — End: 2016-02-08
  Administered 2016-02-08 (×3): 2 mg via INTRAVENOUS

## 2016-02-08 MED ORDER — MEPERIDINE HCL 50 MG/ML IJ SOLN *WRAPPED*
INTRAMUSCULAR | Status: AC
Start: 2016-02-08 — End: 2016-02-08
  Filled 2016-02-08: qty 2

## 2016-02-08 NOTE — Preop H&P (Signed)
OUTPATIENT PRE-PROCEDURE H&P    Chief Complaint / Indications for Procedure: Screening colonoscopy     Past Medical History:     No past medical history on file.  Past Surgical History:   Procedure Laterality Date    LIPOMA RESECTION       Family History   Problem Relation Age of Onset    Cancer Father      Social History     Social History    Marital status: Single     Spouse name: N/A    Number of children: N/A    Years of education: N/A     Social History Main Topics    Smoking status: Current Some Day Smoker     Packs/day: 1.00     Types: Cigarettes    Smokeless tobacco: Never Used    Alcohol use 16.8 oz/week     28 Cans of beer per week    Drug use: Not on file    Sexual activity: Not on file     Other Topics Concern    Not on file     Social History Narrative       Allergies:    Allergies   Allergen Reactions    Neosporin [Bacitracin-Neomycin-Polymyxin] Rash       Medications:    (Not in a hospital admission)    Current Outpatient Prescriptions   Medication    naproxen (NAPROSYN) 250 MG tablet    cyclobenzaprine (FLEXERIL) 10 MG tablet     No current facility-administered medications for this encounter.      There were no vitals filed for this visit.    Review of Systems   Constitutional: Negative for chills and fever.   Respiratory: Negative for shortness of breath.    Cardiovascular: Negative for chest pain.       Physical Examination:  Head/Nose/Throat:negative  Lungs:Lungs clear  Cardiovascular:normal S1 and S2  Abdomen: abdomen soft, non-tender, nondistended, normal active bowel sounds, no masses or organomegaly      Lab Results: All labs in the last 24 hours No results found for this or any previous visit (from the past 24 hour(s)).    Radiology impressions (last 30 days):  No results found.    Currently Active Problems:  Patient Active Problem List   Diagnosis Code    Back pain M54.9    Diverticulitis K57.92    Smoking addiction F17.200    Alcohol consumption heavy Z78.9    Health  care maintenance Z00.00    Left shoulder pain M25.512        Impression:  Screening colonoscopy    Plan:  Colonoscopy  Moderate Sedation    UPDATES TO PATIENT'S CONDITION on the DAY OF SURGERY/PROCEDURE    I. Updates to Patient's Condition (to be completed by a provider privileged to complete a H&P, following reassessment of the patient by the provider):    Full H&P done today; no updates needed.    II. Procedure Readiness   I have reviewed the patient's H&P and updated condition. By completing and signing this form, I attest that this patient is ready for surgery/procedure.      III. Attestation   I have reviewed the updated information regarding the patient's condition and it is appropriate to proceed with the planned surgery/procedure.    Conan Bowens, MD as of 9:44 AM 02/08/2016

## 2016-02-08 NOTE — Discharge Instructions (Signed)
Gastroenterology Unit  Discharge Instructions for Colonoscopy      02/08/2016    9:44 AM    Colonoscopy and Polyp(s) Removed    Do not drive, operate heavy machinery, drink alcoholic beverages, make important personal or business decisions, or sign legal documents until the next day.      Return to your usual diet   Return to taking your usual medications    Things you may expect:   A small amount of bright red blood in your stool   It may be a few days before you have a bowel movement   You may have cramping, bloating, and feelings of "gas". These feelings should go away as you pass gas. If you still feel uncomfortable, walking around will help to pass the gas.   You were given medication to help you relax during the test. You may feel "fuzzy" and drowsy. Go home and rest for at least 4-6 hours.    You should call your doctor for any of the following:   Bad stomach pain   Fever   Bright red bleeding or clots (This may happen up to 20 days after the test.)   Dizziness or weakness that gets worse or lasts up to 24 hours.   Pain or redness at the IV site    If you have a serious problem after hours, Call [phone] to reach the GI physician on call. If you are unable to reach your doctor, go to the [facility] Emergency Department.    Follow Up Care:   If biopsies were taken during your procedure, we will send you the pathology results within 7-10 days. If you do not receive your pathology results after 10 days please call [phone]   Report will be sent to your primary doctor.   Repeat exam in  3 year(s)    New Prescriptions    No medications on file

## 2016-02-08 NOTE — Procedures (Addendum)
Colonoscopy Procedure Note   Date of Procedure: 02/08/2016   Referring Physician: Edrick Oh, DO  Primary Physician: Mardene Sayer, MD        Attending Physician: Charlton Amor, MD  Fellow:   Conan Bowens, MD  Indications:  Colorectal cancer screening-average risk  Previous colonoscopy: No  Medications: Meperidine 50 mg IV and Midazolam 6 mg IV were administered incrementally over the course of the procedure to achieve an adequate level of conscious sedation.  Moderate Sedation Face Times  Start Time: [id number]  End Time: 1048  Duration (minutes): 31 Minutes      Scopes: [id number]    Procedure Details: Full disclosures of risks were reviewed with patient as detailed on the consent form. The patient was placed in the left lateral decubitus position and monitored with continuous pulse oximetry, interval blood pressure monitoring and direct observations.   After anorectal examination was performed, the colonoscope was inserted into the rectum and advanced under direct vision to the terminal ileum. The procedure was considered not difficult.  During withdrawal examination, the final quality of the prep was [facility] Bowel Prep Scale:  Right Colon: Grade3- (entire mucosa of colon segment seen well, with no residual staining, small fragments of stool, or opaque liquid)  Transverse Colon: Grade 3- (entire mucosa of colon segment seen well, with no residual staining, small fragments of stool, or opaque liquid)  Left Colon: Grade 3- (entire mucosa of colon segment seen well, with no residual staining, small fragments of stool, or opaque liquid)  A careful inspection was made as the colonoscope was withdrawn. A retroflexed view of the rectum was not performed; findings and interventions are described below. The patient was recovered in the GI recovery area.     Scope Times:     Insertion Time: 1024  Cecum Time: 1035  Exit Time: [id number]    Findings:  Anorectal:   Normal tone, no masses, and smooth prostate   Small  external hemorrhoidal skin tags   Terminal Ileum:   No masses, polyps, ulceration, erythema, angioectasias, or other mucosal abnormalities noted   Cecum:   No masses, polyps, ulceration, erythema, angioectasias, or other mucosal abnormalities noted   Right Colon:   No masses, polyps, ulceration, erythema, angioectasias, or other mucosal abnormalities noted   Transverse Colon:   Mild diverticulosis noted in transverse colon    No masses, polyps, ulceration, erythema, angioectasias, or other mucosal abnormalities noted   Left Colon:   6 mm polyp removed with cold snare. Retrieved and sent for pathology.    Sigmoid:   8 mm pendunculated polyp removed with hot snare. Retrieved and sent for pathology.     Rectum:   5 mm polyp removed with cold snare. Retrieved and sent for pathology.     Intervention(s):      2 polyp(s) removed by cold snare biopsy   1 polyp(s) removed by hot snare biopsy      Complication(s):     none    Impression(s):     Multiple polyps as removed above.   Diverticulosis, located in the transverse colon.    Recommendation(s):     Await pathology.   Repeat colonoscopy in 3 years.   Follow up with primary care physician.    Histopathologic Diagnosis:  pending  Conan Bowens, MD   Gastroenterology and Hepatology Fellow (PGY4)    I was present throughout the entire procedure and viewing.  I supervised interventions, and I directly participated in interventions as needed, throughout  the entire procedure. I personally ordered and supervised administration of all conscious sedation medications.  I concur with the findings and intervention descriptions as outlined in this edited note.    Nelly Laurence, MD  Associate Professor of Clinical Medicine  Attending Physician, Division of Gastroenterology and Hepatology, [facility]

## 2016-02-09 ENCOUNTER — Telehealth: Payer: Self-pay

## 2016-02-09 NOTE — Telephone Encounter (Signed)
Left message

## 2016-02-10 LAB — SURGICAL PATHOLOGY

## 2016-02-11 ENCOUNTER — Ambulatory Visit: Payer: Self-pay

## 2016-02-23 ENCOUNTER — Ambulatory Visit: Payer: Medicaid (Managed Care) | Admitting: Family Medicine

## 2016-02-23 ENCOUNTER — Encounter: Payer: Self-pay | Admitting: Family Medicine

## 2016-02-23 ENCOUNTER — Ambulatory Visit: Payer: Medicaid (Managed Care) | Attending: Geriatric Medicine | Admitting: Family Medicine

## 2016-02-23 DIAGNOSIS — F17200 Nicotine dependence, unspecified, uncomplicated: Secondary | ICD-10-CM

## 2016-02-23 DIAGNOSIS — D1721 Benign lipomatous neoplasm of skin and subcutaneous tissue of right arm: Secondary | ICD-10-CM

## 2016-02-23 DIAGNOSIS — M25512 Pain in left shoulder: Secondary | ICD-10-CM

## 2016-02-23 MED ORDER — VARENICLINE TARTRATE 0.5 MG X 11 & 1 MG X 42 PO MISC *A*
ORAL | 0 refills | Status: AC
Start: 2016-02-23 — End: ?

## 2016-02-23 MED ORDER — NICOTINE 14 MG/24HR TD PT24 *I*
1.0000 | MEDICATED_PATCH | Freq: Every day | TRANSDERMAL | 5 refills | Status: AC
Start: 2016-02-23 — End: ?
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
## 2016-03-13 ENCOUNTER — Telehealth: Payer: Self-pay

## 2016-03-13 NOTE — Telephone Encounter (Signed)
Patient requesting new referral for physical therapy for Carl Massey. Please follow up

## 2016-03-15 ENCOUNTER — Other Ambulatory Visit: Payer: Self-pay | Admitting: Family Medicine

## 2016-03-15 DIAGNOSIS — M25512 Pain in left shoulder: Secondary | ICD-10-CM

## 2016-03-20 ENCOUNTER — Ambulatory Visit: Payer: Self-pay | Admitting: Rehabilitative and Restorative Service Providers"

## 2016-03-20 NOTE — Progress Notes (Signed)
03/20/16 0700   Overview   Diagnosis left shoulder pain, RTC sprain   Public relations account executive Accident   Script Date 03/15/16   Missed Visit Canceled   Additional Comments Patient to see a shoulder specialist prior to starting PT again. Increased pain with exercises.   Charges   No Charge Code No Charge Code       Lorenza Chick PT, DPT

## 2016-03-20 NOTE — Progress Notes (Deleted)
03/20/16 0700   Overview   Diagnosis left shoulder pain, RTC sprain   Public relations account executive Accident   Script Date 03/15/16   Missed Visit Canceled   Charges   No Charge Code No Charge Code       Lorenza Chick PT, DPT
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
## 2016-04-04 ENCOUNTER — Ambulatory Visit: Payer: Medicaid (Managed Care) | Admitting: Family Medicine

## 2016-04-04 ENCOUNTER — Ambulatory Visit: Payer: Medicaid (Managed Care) | Attending: Geriatric Medicine | Admitting: Family Medicine

## 2016-04-04 ENCOUNTER — Encounter: Payer: Self-pay | Admitting: Family Medicine

## 2016-04-04 DIAGNOSIS — M25512 Pain in left shoulder: Secondary | ICD-10-CM

## 2016-04-04 DIAGNOSIS — D1721 Benign lipomatous neoplasm of skin and subcutaneous tissue of right arm: Secondary | ICD-10-CM

## 2016-04-04 DIAGNOSIS — F17200 Nicotine dependence, unspecified, uncomplicated: Secondary | ICD-10-CM
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
## 2016-04-10 ENCOUNTER — Ambulatory Visit: Payer: Self-pay | Admitting: Plastic Surgery

## 2016-04-10 VITALS — BP 133/90 | HR 78 | Temp 98.4°F | Resp 18 | Ht 64.96 in | Wt 167.0 lb

## 2016-04-10 DIAGNOSIS — R222 Localized swelling, mass and lump, trunk: Secondary | ICD-10-CM

## 2016-04-10 NOTE — Invasive Procedure Plan of Care (Signed)
Invasive Procedure Plan of Care (Consent Form 419):   Condition(s) Addressed: back mass   Person Performing Procedure: AMALFI, ASHLEY   Side: Not applicable    Procedure: Excision of back mass   Special Equipment:    Planned Anesthesia: Local   Benefits:    Risks: Infection, pain, bleeding, scarring, delayed healing, reaction to local anesthetic, seroma, hematoma, recurrence of lesion/mass, change in sensation or need for further surgery.   Alternatives: No treatment   Expected Length of Stay:  day(s)   Pt Decision: Agrees to proceed     ----------------------------------------------------------------------------------------------------------------------------------------  Consent:  I hereby give my consent and authorize AMALFI, ASHLEY  (The list of possible assistants, all of whom are privileged to provide surgical services at the hospital, is available)  To treat the following: back mass  Procedure includes: Excision of back mass  1 The care provider has explained my condition to me, the benefits of having the above treatment procedure, and alternate ways of treating my condition. I understand that no guarantees have been made to me about the result of the treatment. The alternatives to this procedure include: No treatment   2 The care provider has discussed with me the reasonably foreseeable risks of the treatment and that there may be undesirable results. The risks that are specifically related to this procedure include: Infection, pain, bleeding, scarring, delayed healing, reaction to local anesthetic, seroma, hematoma, recurrence of lesion/mass, change in sensation or need for further surgery.   3 I understand that during the treatment a condition may be discovered which was not known before the treatment started. Therefore, I authorize the care provider to perform any additional or different treatment which is thought necessary and available.   4 Any tissue, parts, or substances removed during the procedure  may be retained or disposed of in accordance with customary scientific, educational and clinical practice.   5 Vendor information if appropriate:      6 Patient Consent for Medical or Surgical Procedure: I have carefully read and fully understand this informed consent form, and have had sufficient opportunity to discuss my condition and the above procedure(s) with the care provider and his/her associates, and all of my questions have been answered to my satisfaction.    Agrees to proceed       ____________________________________________________   _______________   Signature of Patient  Date/Time     ____________________________________________________   _______________   Signature of Parent or Legal Guardian  (if Patent is unable to sign or is a minor) Date/Time       Complete this section for all OR procedures and all other invasive internal procedures performed in any setting.  7 Consent for Receipt of Tissue(s): Not applicable   8 For those procedures that have the potential for significant blood loss: not applicable.   9 Patient Consent for Blood/Tissue: Not applicable.    Patient sign here unless 7 and 8 do not apply.       ____________________________________________________   _______________   Signature of Patient Date/Time     ____________________________________________________   _______________   Signature of Parent or Legal Guardian  (if Patent is unable to sign or is a minor) Date/Time      I have discussed the planned procedure, including the potential for any transfusion of blood products or receipt of tissue as necessary, expected benefits, the potential complications and risks and possible alternatives and their benefits and risks with the patient or the patient's surrogate. In my opinion, the  patent or the patient's surrogate understands the proposed procedure, its risks, benefits, and alternatives.    Electronically signed by Earna Coder, NP at 1:40 PM
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
## 2016-04-11 ENCOUNTER — Encounter: Payer: Self-pay | Admitting: Orthopedic Surgery

## 2016-04-11 ENCOUNTER — Ambulatory Visit: Payer: Self-pay | Admitting: Orthopedic Surgery

## 2016-04-11 ENCOUNTER — Ambulatory Visit: Payer: Self-pay | Admitting: Sports Medicine

## 2016-04-11 VITALS — BP 141/93 | Ht 64.96 in | Wt 164.0 lb

## 2016-04-11 DIAGNOSIS — M25512 Pain in left shoulder: Secondary | ICD-10-CM | POA: Insufficient documentation

## 2016-04-11 DIAGNOSIS — M7550 Bursitis of unspecified shoulder: Secondary | ICD-10-CM | POA: Insufficient documentation

## 2016-04-11 DIAGNOSIS — M67912 Unspecified disorder of synovium and tendon, left shoulder: Secondary | ICD-10-CM | POA: Insufficient documentation

## 2016-04-11 NOTE — Patient Instructions (Signed)
Injection Instructions    · The injection you received today may take 5-7 days to work.  · You may find the area that was injected to be more painful for 1 or 2 days after the injection.  This happens as the medicine is being absorbed in your body.  · It may be helpful to put ice on the body part that was injected to ease the pain.  · You may also use pain medication - Tylenol, Advil/Motrin or Aleve.  · Remember, it may take 5-7 days for you to feel the full results of the injection.  · If you are diabetic the medicine in the injection can increase your blood sugar level for several days.  Monitor your glucose levels closely.
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
## 2016-04-26 ENCOUNTER — Ambulatory Visit: Payer: Medicaid Other | Attending: Geriatric Medicine | Admitting: Family Medicine

## 2016-04-26 ENCOUNTER — Encounter: Payer: Self-pay | Admitting: Family Medicine

## 2016-04-26 VITALS — BP 144/92 | HR 72 | Temp 98.7°F | Ht 65.35 in | Wt 165.0 lb

## 2016-04-26 DIAGNOSIS — M25512 Pain in left shoulder: Secondary | ICD-10-CM

## 2016-04-26 DIAGNOSIS — Z131 Encounter for screening for diabetes mellitus: Secondary | ICD-10-CM

## 2016-04-26 DIAGNOSIS — F17200 Nicotine dependence, unspecified, uncomplicated: Secondary | ICD-10-CM

## 2016-04-26 DIAGNOSIS — I10 Essential (primary) hypertension: Secondary | ICD-10-CM

## 2016-04-26 DIAGNOSIS — Z0000 Encounter for general adult medical examination without abnormal findings: Secondary | ICD-10-CM

## 2016-04-26 NOTE — Progress Notes (Signed)
HPI:    1) Smoking cessation  Stress at work, sometimes smokes more, sometimes less. Less smoking after dinner.   Using patches after Christmas.  Currently smoking 1 ppd  Holiday plans - Son visiting from [place]  Work around the holidays has been busy    2) Left shoulder pain  12/5//17 injection by ortho  Injection at ortho office almost back to same symptoms  Located top and anterior today, dull with sharp when exacerbated by certain shoulder movements.   Still working, doing some light work  Mood and energy okay  Sleep off and on  Naproxen helpful   Flexeril not as much   Ice and heat helpful    HCP updated    Alcohol: wine daily with dinner, 1-2 beers during weeknights. Weekends 4-5 beers. 3/7 days with 6 or more drinks.     3) HTN  Mild HA resolving with ibuprofen  Otherwise asymptomatic   Discussed medication options - not interested in starting any medications today  Discussed blood work - He will try to obtain on Saturday morning    ROS:  ROS as above. No current HA, no vision changes, CP, SOB, abd pain.         PMH, PSH, allergies and medications have been reviewed in other sections.    Physical exam  BP (!) 144/92 (BP Location: Right arm)   Pulse 72   Temp 37.1 C (98.7 F) (Temporal)    Ht 1.66 m (5' 5.35") Comment: transcribed   Wt 74.8 kg (165 lb)   BMI 27.16 kg/m2      Gen: NAD, well-appearing, man appearing of stated age  3:    Head: Atraumatic    Ear: External ear normal   Eyes: EOMI, sclera non-icteric, non-injected, eyelids non-erythematous with no edema, trauma, or lid lag.   CV: RRR, no m/g/r.   Resp: CTA bilaterally.   MSK: ROM of left shoulder reduced, notable for 4+/5 supraspinatus strength. Infraspinatus and subscapularis 5/5. Special tests: Neers positive, Hawkins positive, Speeds positive.   Ext: 2+ pulses, <2s cap refill  Psych: goal oriented responses, appropriate affect.     Assessment/Plan:  Mr. Kaden is a 55 yo M with PMHx of left shoulder pain, nicotine dependence, HTN, who  presents for followup of theses issues.       Encounter Diagnoses   Name Primary?    Left shoulder pain Yes    Smoking addiction     Screening for diabetes mellitus     Healthcare maintenance     Hypertension, unspecified type      1. Left shoulder pain  Reviewed visit with Ortho on 12/5  Minimal benefit from Celestone injection  Encouraged him to call to re-evaluate if not getting expected pain relief    2. Smoking addiction  Discussed cutting back  Encouraged current progress  Will check in after goal date    3. Screening for diabetes mellitus  - Hemoglobin A1c; Future    4. Healthcare maintenance  - Comprehensive metabolic panel; Future  - Lipid add Rfx to Drt LDL if Trig >400; Future  - CBC; Future  - Hemoglobin A1c; Future    5. Hypertension, unspecified type  Recheck improved but above goal  Encouraged smoking cessation  Offered medications but patient declined  Follow up at next visit.     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  S:  1. Screening for diabetes mellitus    2. Smoking addiction    3. Healthcare maintenance        O: BP (!) 144/92 (BP Location: Right arm)   Pulse 72   Temp 37.1 C (98.7 F) (Temporal)    Ht 1.66 m (5' 5.35") Comment: transcribed   Wt 74.8 kg (165 lb)   BMI 27.16 kg/m2    A/P:   1. Screening for diabetes mellitus  Hemoglobin A1c   2. Smoking addiction     3. Healthcare maintenance  Comprehensive metabolic panel    Lipid add Rfx to Drt LDL if Trig >400    CBC    Hemoglobin A1c       Rito Ehrlich, MD

## 2016-04-30 ENCOUNTER — Encounter: Payer: Self-pay | Admitting: Family Medicine

## 2016-04-30 DIAGNOSIS — I10 Essential (primary) hypertension: Secondary | ICD-10-CM

## 2016-04-30 HISTORY — DX: Essential (primary) hypertension: I10

## 2017-04-23 ENCOUNTER — Encounter: Payer: Self-pay | Admitting: Plastic Surgery

## 2017-04-23 DIAGNOSIS — R222 Localized swelling, mass and lump, trunk: Secondary | ICD-10-CM | POA: Insufficient documentation

## 2017-05-23 NOTE — Anesthesia Preprocedure Evaluation (Addendum)
Anesthesia Pre-operative History and Physical for Carl Massey    Highlighted Issues for this Procedure:  57 y.o. male with Mass on back (R22.2) presenting for Procedure(s):  Excision of right upper back mass by Surgeon(s):  Laureen Ochs, MD scheduled for 45 minutes.        .  Anesthesia Evaluation Information Source: records, patient     ANESTHESIA HISTORY  Pertinent(-):  No History of anesthetic complications or Family hx of anesthetic complications    GENERAL     Denies general issues  Pertinent (-):  No obesity    HEENT     Denies HEENT issues PULMONARY    + Smoker          tobacco currently, tobacco advised to quit    + Snoring  Pertinent(-):  No asthma, recent URI or COPD    CARDIOVASCULAR  Good(4+METs) Exercise Tolerance    + Hypertension (no meds)    GI/HEPATIC/RENAL  Last PO Intake: >8hr before procedure and >2hr before procedure (clears)    + Alcohol use          >2 drinks/day  Pertinent(-):  No GERD, liver  issues or pancreatic issues NEURO/PSYCH    + Chronic pain          lower back    ENDO/OTHER     Denies endo issues  Pertinent(-):  No diabetes mellitus    HEMATOLOGIC     Denies hematologic issues       Physical Exam    Airway            Mouth opening: normal            Mallampati: II            TM distance (fb): <3 FB            Neck ROM: full  Dental   Normal Exam   Cardiovascular           Rhythm: regular           Rate: normal         Pulmonary   Normal Exam    No cough    Mental Status     oriented to person, place and time         ________________________________________________________________________  PLAN  ASA Score  2  Anesthetic Plan general     Induction (routine IV) General Anesthesia/Sedation Maintenance Plan (inhaled agents); Airway (LMA); Line ( use current access); Monitoring (standard ASA); Positioning (lateral decubitus); PONV Plan (dexamethasone and ondansetron); Pain (per surgical team); PostOp (PACU)    Informed Consent     Risks:          Risks discussed were commensurate  with the plan listed above with the following specific points: aspiration, N/V, sore throat, hypotension, dizziness and fatigue , damage to:(eyes, teeth), allergic Rx, awareness    Anesthetic Consent:         Anesthetic plan (and risks as noted above) were discussed with patient    Plan also discussed with team members including:       CRNA    Attending Attestation:  As the primary attending anesthesiologist, I attest that the patient or proxy understands and accepts the risks and benefits of the anesthesia plan. I also attest that I have personally performed a pre-anesthetic examination and evaluation, and prescribed the anesthetic plan for this particular location within 48 hours prior to the anesthetic as documented. Pryor Ochoa, MD 11:35 AM

## 2017-05-25 ENCOUNTER — Encounter: Payer: Self-pay | Admitting: Anesthesiology

## 2017-05-25 ENCOUNTER — Encounter: Payer: Medicaid (Managed Care) | Admitting: Anesthesiology

## 2017-05-25 ENCOUNTER — Encounter
Admission: RE | Disposition: A | Discharge status: Home / Self Care | Payer: Self-pay | Source: Ambulatory Visit | Attending: Plastic Surgery

## 2017-05-25 ENCOUNTER — Ambulatory Visit
Admission: RE | Admit: 2017-05-25 | Discharge: 2017-05-25 | Disposition: A | Discharge status: Home / Self Care | Payer: Medicaid (Managed Care) | Source: Ambulatory Visit | Attending: Plastic Surgery | Admitting: Plastic Surgery

## 2017-05-25 DIAGNOSIS — I10 Essential (primary) hypertension: Secondary | ICD-10-CM | POA: Insufficient documentation

## 2017-05-25 DIAGNOSIS — R222 Localized swelling, mass and lump, trunk: Secondary | ICD-10-CM

## 2017-05-25 DIAGNOSIS — D171 Benign lipomatous neoplasm of skin and subcutaneous tissue of trunk: Secondary | ICD-10-CM | POA: Insufficient documentation

## 2017-05-25 DIAGNOSIS — F17200 Nicotine dependence, unspecified, uncomplicated: Secondary | ICD-10-CM | POA: Insufficient documentation

## 2017-05-25 HISTORY — PX: PR EXCISION TUMOR SOFT TIS BACK/FLANK SUBQ 3 CM/>: 21931

## 2017-05-25 HISTORY — PX: PR EXCISION TUMOR SOFT TISSUE BACK/FLANK SUBQ 3+CM: 21931

## 2017-05-25 SURGERY — EXCISION, MASS, BACK
Anesthesia: General | Laterality: Right | Wound class: Clean

## 2017-05-25 MED ORDER — LIDOCAINE-EPINEPHRINE 1 %-1:100000 IJ SOLN *I*
INTRAMUSCULAR | Status: DC | PRN
Start: 2017-05-25 — End: 2017-05-25
  Administered 2017-05-25: 26 mL via SUBCUTANEOUS
  Administered 2017-05-25: 10 mL via SUBCUTANEOUS

## 2017-05-25 MED ORDER — DEXAMETHASONE SODIUM PHOSPHATE 4 MG/ML INJ SOLN *WRAPPED*
INTRAMUSCULAR | Status: DC | PRN
Start: 2017-05-25 — End: 2017-05-25
  Administered 2017-05-25: 4 mg via INTRAVENOUS

## 2017-05-25 MED ORDER — ACETAMINOPHEN 500 MG PO TABS *I*
500.0000 mg | ORAL_TABLET | Freq: Three times a day (TID) | ORAL | Status: AC | PRN
Start: 2017-05-25 — End: ?

## 2017-05-25 MED ORDER — PHENYLEPHRINE 100 MCG/ML IN NS 10 ML *WRAPPED*
INTRAMUSCULAR | Status: DC | PRN
Start: 2017-05-25 — End: 2017-05-25
  Administered 2017-05-25 (×2): 100 ug via INTRAVENOUS

## 2017-05-25 MED ORDER — FENTANYL CITRATE 50 MCG/ML IJ SOLN *WRAPPED*
INTRAMUSCULAR | Status: DC | PRN
Start: 2017-05-25 — End: 2017-05-25
  Administered 2017-05-25: 100 ug via INTRAVENOUS

## 2017-05-25 MED ORDER — PROPOFOL 10 MG/ML IV EMUL (INTERMITTENT DOSING) WRAPPED *I*
INTRAVENOUS | Status: DC | PRN
Start: 2017-05-25 — End: 2017-05-25
  Administered 2017-05-25: 50 mg via INTRAVENOUS
  Administered 2017-05-25: 210 mg via INTRAVENOUS

## 2017-05-25 MED ORDER — LIDOCAINE HCL 2 % IJ SOLN *I*
INTRAMUSCULAR | Status: DC | PRN
Start: 2017-05-25 — End: 2017-05-25
  Administered 2017-05-25: 60 mg via INTRAVENOUS

## 2017-05-25 MED ORDER — MIDAZOLAM HCL 1 MG/ML IJ SOLN *I* WRAPPED
INTRAMUSCULAR | Status: AC
Start: 2017-05-25 — End: 2017-05-25
  Filled 2017-05-25: qty 2

## 2017-05-25 MED ORDER — EPHEDRINE 5MG/ML IN NS IV/IJ *WRAPPED*
INTRAMUSCULAR | Status: DC | PRN
Start: 2017-05-25 — End: 2017-05-25
  Administered 2017-05-25: 10 mg via INTRAVENOUS

## 2017-05-25 MED ORDER — FENTANYL CITRATE 50 MCG/ML IJ SOLN *WRAPPED*
INTRAMUSCULAR | Status: AC
Start: 2017-05-25 — End: 2017-05-25
  Filled 2017-05-25: qty 2

## 2017-05-25 MED ORDER — KETOROLAC TROMETHAMINE 30 MG/ML IJ SOLN *I*
INTRAMUSCULAR | Status: DC | PRN
Start: 2017-05-25 — End: 2017-05-25
  Administered 2017-05-25: 30 mg via INTRAVENOUS

## 2017-05-25 MED ORDER — CEFAZOLIN 1000 MG IN STERILE WATER 10ML SYRINGE *I*
2000.0000 mg | PREFILLED_SYRINGE | Freq: Once | INTRAVENOUS | Status: AC
Start: 2017-05-25 — End: 2017-05-25

## 2017-05-25 MED ORDER — GLYCOPYRROLATE 0.2 MG/ML IJ SOLN *WRAPPED*
INTRAMUSCULAR | Status: DC | PRN
  Administered 2017-05-25: .3 mg via INTRAVENOUS

## 2017-05-25 MED ORDER — IBUPROFEN 400 MG PO TABS *I*
800.0000 mg | ORAL_TABLET | Freq: Three times a day (TID) | ORAL | Status: AC | PRN
Start: 2017-05-25 — End: ?

## 2017-05-25 MED ORDER — LIDOCAINE HCL (PF) 1 % IJ SOLN *I*
0.1000 mL | Freq: Once | INTRAMUSCULAR | Status: AC | PRN
Start: 2017-05-25 — End: 2017-05-25

## 2017-05-25 MED ORDER — LACTATED RINGERS IV SOLN *I*
20.0000 mL/h | INTRAVENOUS | Status: DC
Start: 2017-05-25 — End: 2017-05-26
  Administered 2017-05-25: 20 mL/h via INTRAVENOUS

## 2017-05-25 MED ORDER — ONDANSETRON HCL 2 MG/ML IV SOLN *I*
INTRAMUSCULAR | Status: DC | PRN
Start: 2017-05-25 — End: 2017-05-25
  Administered 2017-05-25: 4 mg via INTRAVENOUS

## 2017-05-25 MED ORDER — CEFAZOLIN 1000 MG IN STERILE WATER 10ML SYRINGE *I*
PREFILLED_SYRINGE | INTRAVENOUS | Status: AC
Start: 2017-05-25 — End: 2017-05-25
  Filled 2017-05-25: qty 20

## 2017-05-25 MED ORDER — ACETAMINOPHEN 160 MG/5 ML WRAPPED *I*
975.0000 mg | Freq: Once | Status: AC
Start: 2017-05-25 — End: 2017-05-25

## 2017-05-25 MED ORDER — ACETAMINOPHEN 325 MG PO TABS *I*
ORAL_TABLET | ORAL | Status: AC
Start: 2017-05-25 — End: 2017-05-25
  Filled 2017-05-25: qty 3

## 2017-05-25 MED ORDER — ACETAMINOPHEN 80 MG PO TBDP *I*
960.0000 mg | ORAL_TABLET | Freq: Once | ORAL | Status: AC
Start: 2017-05-25 — End: 2017-05-25

## 2017-05-25 MED ORDER — OXYCODONE HCL 5 MG/5ML PO SOLN *I*
10.0000 mg | Freq: Once | ORAL | Status: DC | PRN
Start: 2017-05-25 — End: 2017-05-26

## 2017-05-25 MED ORDER — OXYCODONE HCL 5 MG PO CAPS *A*
5.0000 mg | ORAL_CAPSULE | Freq: Four times a day (QID) | ORAL | 0 refills | Status: AC | PRN
Start: 2017-05-25 — End: ?

## 2017-05-25 MED ORDER — MIDAZOLAM HCL 1 MG/ML IJ SOLN *I* WRAPPED
INTRAMUSCULAR | Status: DC | PRN
Start: 2017-05-25 — End: 2017-05-25
  Administered 2017-05-25: 2 mg via INTRAVENOUS

## 2017-05-25 MED ORDER — ACETAMINOPHEN 325 MG PO TABS *I*
975.0000 mg | ORAL_TABLET | Freq: Once | ORAL | Status: AC
Start: 2017-05-25 — End: 2017-05-25
  Administered 2017-05-25: 975 mg via ORAL

## 2017-05-25 MED ORDER — OXYCODONE HCL 5 MG/5ML PO SOLN *I*
5.0000 mg | Freq: Once | ORAL | Status: DC | PRN
Start: 2017-05-25 — End: 2017-05-26

## 2017-05-25 SURGICAL SUPPLY — 28 items
ADHESIVE MASTISOL 2/3CC VIAL (Dressing) ×2 IMPLANT
APPLICATOR CHLORAPREP 26ML ORANGE LARGE (Solution) ×3 IMPLANT
BLADE SURG CARBON STEEL #10 STER (Supply) ×3 IMPLANT
CLOSURE STERI-STRIP REINF .5 X 4IN LF (Dressing) ×3 IMPLANT
CONTAINER FORMALIN PREFILL 60ML (Supply) ×3 IMPLANT
DRAIN WOUND RND SIL 15 FR HUBLESS TROCAR (Supply) IMPLANT
DRAPE LAP W/INSTRUMENT PAD (Drape) ×3 IMPLANT
DRESSING PRIMAPORE 11-3/4X4 (Dressing) IMPLANT
DRESSING PRIMAPORE 4X3-1/9 (Dressing) IMPLANT
DRESSING PRIMAPORE 6X3-1/8 (Dressing) IMPLANT
DRESSING PRIMAPORE 8 X 4IN (Dressing) IMPLANT
DRESSING TEGADERM 4 X 4 3/4IN (Dressing) ×3 IMPLANT
DRESSING TEGADERM W/LABEL 2 3/8 X 2 3/4 (Dressing) ×3 IMPLANT
DRESSING TELFA 8X3 RELEASE (Dressing) ×3 IMPLANT
ELECTRODE BLADE MODIFIED 2.5IN (Supply) ×3 IMPLANT
GLOVE SURG GAMMEX DERMAPRENE ULTRA SZ6.5 PF LF (Glove) ×3 IMPLANT
GOWN SIRIUS RAGLAN NONREINFORCED XL (Gown) ×3 IMPLANT
GOWN SIRUS NONREINFORCED SMALL (Other) ×3 IMPLANT
NEEDLE HYPO BVL LF 25G X 1.5IN (Needle) ×3 IMPLANT
PACK CUSTOM MINOR GENERAL (Pack) ×3 IMPLANT
PACK TOWEL LIGHT BLUE STERILE (Pack) ×6 IMPLANT
PROTECTOR ULNA NERVE ~~LOC~~ (Supply) ×2 IMPLANT
SPONGE CURITY 2FT 3 X 4IN LF STER (Dressing) IMPLANT
SUTR MONCRYL 4-0 PS-2 UND 27IN (Suture) ×3 IMPLANT
SUTR MONOCRYL PLUS 3-0PS2 27IN UNDY (Suture) ×3 IMPLANT
SUTR PDS II MONO 3-0 PS2 18IN CLEAR (Suture) ×2 IMPLANT
SUTR SILK 4-0 J-1 18 IN BLACK (Suture) IMPLANT
SUTR VICRYL ANTIB 2-0 SH 27 UNDY (Suture) IMPLANT
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
## 2017-05-25 NOTE — Anesthesia Postprocedure Evaluation (Signed)
Anesthesia Post-Op Note    Patient: Carl Massey    Procedure(s) Performed:  Procedure Summary  Date:  05/25/2017 Anesthesia Start: 05/25/2017 11:56 AM Anesthesia Stop: 05/25/2017  1:22 PM Room / Location:  [id number] / [id number]   Procedure(s):  Excision of right upper back mass Diagnosis:  Mass on back [R22.2] Surgeon(s):  Laureen Ochs, MD  Wing[place], Guilford Shi, MD Attending Anesthesiologist:  Pryor Ochoa, MD         Recovery Vitals  BP: 133/86 (05/25/2017  1:47 PM)  Heart Rate: 60 (05/25/2017 10:06 AM)  Heart Rate (via Pulse Ox): 71 (05/25/2017  1:47 PM)  Resp: 16 (05/25/2017  1:47 PM)  Temp: 36.2 C (97.2 F) (05/25/2017  1:54 PM)  SpO2: 100 % (05/25/2017  1:47 PM)   0-10 Scale: 0 (05/25/2017  1:47 PM)  Anesthesia type:  General  Complications Noted During Procedure or in PACU:  None   Comment:    Patient Location:  PACU  Level of Consciousness:    Recovered to baseline  Patient Participation:     Able to participate  Temperature Status:    Normothermic  Oxygen Saturation:    Within patient's normal range  Cardiac Status:   Within patient's normal range  Fluid Status:    Stable  Airway Patency:     Yes  Pulmonary Status:    Baseline  Pain Management:    Adequate analgesia  Nausea and Vomiting:  None    Post Op Assessment:    Tolerated procedure well   Attending Attestation:  All indicated post anesthesia care provided     -

## 2017-05-25 NOTE — Anesthesia Procedure Notes (Signed)
---------------------------------------------------------------------------------------------------------------------------------------    AIRWAY   GENERAL INFORMATION AND STAFF    Patient location during procedure: OR       Date of Procedure: 05/25/2017 12:17 PM  CONDITION PRIOR TO MANIPULATION     Current Airway/Neck Condition:  Normal        For more airway physical exam details, see Anesthesia PreOp Evaluation  AIRWAY METHOD     Preoxygenated: yes      Induction: IV  Mask Difficulty Assessment:  0 - not attempted    Number of Attempts at Approach:  1  FINAL AIRWAY DETAILS    Final Airway Type:  LMA    Final LMA: Unique    LMA Size: 5  ----------------------------------------------------------------------------------------------------------------------------------------

## 2017-05-25 NOTE — INTERIM OP NOTE (Signed)
Interim Op Note (Surgical Log ID: [id number])       Date of Surgery: 05/25/2017       Surgeons: Surgeon(s) and Role:     * Laureen Ochs, MD - Primary     * Denson Niccoli, Guilford Shi, MD - Resident - Assisting       Pre-op Diagnosis: Pre-Op Diagnosis Codes:     * Mass on back [R22.2]       Post-op Diagnosis: Post-Op Diagnosis Codes:     * Mass on back [R22.2]       Procedure(s) Performed: Procedure:    Excision of right upper back mass  CPT(R) Code:  21931 - PR EXCISION TUMOR SOFT TISSUE BACK/FLANK SUBQ 3+CM         Additional CPT Codes:        Anesthesia Type: General        Fluid Totals: I/O this shift:  01/18 0700 - 01/18 1459  In: 1200 (16.4 mL/kg) [I.V.:1200]  Out: - (0 mL/kg)   Net: 1200  Weight: 73 kg        Estimated Blood Loss: * No values recorded between 05/25/2017 11:56 AM and 05/25/2017  1:09 PM *       Specimens to Pathology:  ID Type Source Tests Collected by Time Destination   A : back mass  TISSUE Tissue SURGICAL PATHOLOGY Laureen Ochs, MD 05/25/2017 1236           Temporary Implants:        Packing:                 Patient Condition: good       Findings (Including unexpected complications): Left back mass excision     Signed:  Hendricks Limes, MD  on 05/25/2017 at 1:09 PM

## 2017-05-25 NOTE — Anesthesia Case Conclusion (Signed)
CASE CONCLUSION  Emergence  Actions:  LMA removed and soft bite block  Criteria Used for Airway Removal:  Adequate Tv & RR, acceptable O2 saturation and 5 sec head lift  Assessment:  Routine  Transport  Directly to: PACU  Position:  Recumbent  Patient Condition on Handoff  Level of Consciousness:  Alert/talking/calm  Patient Condition:  Stable  Handoff Report to:  RN

## 2017-05-25 NOTE — Interval H&P Note (Signed)
UPDATES TO PATIENT'S CONDITION on the DAY OF SURGERY/PROCEDURE    I. Updates to Patient's Condition (to be completed by a provider privileged to complete a H&P, following reassessment of the patient by the provider):    Day of Surgery/Procedure Update:  History  History reviewed and no change    Physical  Physical exam updated and no change              II. Procedure Readiness   I have reviewed the patient's H&P and updated condition. By completing and signing this form, I attest that this patient is ready for surgery/procedure.    III. Attestation   I have reviewed the updated information regarding the patient's condition and it is appropriate to proceed with the planned surgery/procedure.    Laureen Ochs, MD as of 11:46 AM 05/25/2017

## 2017-05-25 NOTE — Op Note (Signed)
Operative Note (Surgical Case/Log ID: [id number])       Date of Surgery: 05/25/2017       Surgeons: Juliann Mule) and Role:     * Laureen Ochs, MD - Primary     * Wingate, Guilford Shi, MD - Resident - Assisting       Pre-op Diagnosis: Pre-Op Diagnosis Codes:     * Mass on back [R22.2]       Post-op Diagnosis: Post-Op Diagnosis Codes:     * Mass on back [R22.2]       Procedure(s) Performed: Procedure:    Excision of right upper back mass  CPT(R) Code:  21931 - PR EXCISION TUMOR SOFT TISSUE BACK/FLANK SUBQ 3+CM         Additional CPT Codes:        Anesthesia Type: General        Fluid Totals: No intake/output data recorded.       Estimated Blood Loss: 10 mL       Specimens to Pathology:  ID Type Source Tests Collected by Time Destination   A : back mass  TISSUE Tissue SURGICAL PATHOLOGY Laureen Ochs, MD 05/25/2017 1236           Temporary Implants:        Packing:                 Patient Condition: good       Indications: Growing mass        Findings (Including unexpected complications): Lipoma      Description of Procedure: Preoperative Diagnosis : Soft Tissue Mass  Postoperative Diagnosis: Same  Procedure: Excision of soft tissue mass      The patient's consent was obtained.  Local anesthetic was placed in the area and epinephrine allowed to take effect.  They were prepped and draped in sterile fashion.   The mass is located on the back .  The mass is 12 x 8 cm.    An incision was made directly atop the mass with a 15 blade through dermis.  The mass was immediately identified.  Blunt dissection was used to carefully dissect the mass away from the surrounding soft tissue.  The base of the mass was sharply excised from the surrounding tissue and it was removed completely.  It was atop the trapezius muscle which was cauterized. Local was used in the muscle as well.     The wound was irrigated with saline solution.  Gentle pressure was used to control the bleeding.  The area was closed in a multi-layered fashion.  Deep  sutures were placed to close the dead space.  The skin was then closed in multi-layered fashion using a 3-0 Monocryl suture and a running monocryl suture in the skin.    The total closure measured 9 cm.   Antibiotic ointment was place atop the sutures.     Post Op Instructions:   The patient will follow up in a week for suture removal.   They were instructed to keep the area dry for 24-48 hours and then they may begin washing with soap and water.   They were instructed to use lotion to massage the area once healed, an to use sunscreen to avoid darkly pigmented scars.   They should call with any new issues or questions. We will call the patient once permanent pathology is available.          Signed:  Laureen Ochs, MD  on 05/25/2017 at 4:29 PM

## 2017-05-25 NOTE — Discharge Instructions (Signed)
DISCHARGE INSTRUCTIONS      DATE OF SURGERY: 05/25/2017         PROCEDURE: Left back mass excision    SURGEON: Laureen Ochs, MD    FOR AMBULATORY SURGICAL PATIENTS:  You have received sedative medication and/or general anesthesia which may make you drowsy for as long as 24 hours.     A.) DO NOT drive or operate any machinery for 24 hours    B.) DO NOT drink alcoholic beverages for 24 hours    C.) DO NOT make major decisions, sign contracts, etc. for 24 hours    DIET: Resume your usual healthy diet    ACTIVITY: No strenuous exercise. You may gradually increase your activity back to your usual daily activities.     CARE OF DRESSING OR INCISION: You may change your dressing as needed. Do not peel off steristrips (skin tape) or dermabond (skin glue).     BATHING/SHOWERING: You may shower 3 day(s) after your surgery. Do not have the shower directly hit your incision(s). Do not soak in tubs, pools, lakes, oceans, etc for at least 2 weeks. Do not rub your sutures. Pat all of your incisions dry with a clean towel or paper towel.  No lotions or creams on your incisions.    SMOKING: Smoking can reduce the quality of your wound healing and increase your chances of wound infections, as well as increase your chances of developing chronic health problems or worsen conditions you already have. If you smoke, you should quit. Smoking cessation information is available for your review to help you quit. Medications to help you quit are available. Ask your primary care physician if you would like to receive these medications.    DIABETES: If you are diabetic, check your blood sugar at least daily. Poor blood sugar control can lead to delayed wound healing and infections. Follow up with your primary care physician or endocrinologist to discuss your diabetic care regimen.    PAIN MANAGEMENT: Narcotic use is associated with significant adverse effects, including but not limited to nausea, constipation, itching, drowsiness, and  addiction. Narcotic use should be reserved for severe pain that is not relieved by non-narcotic medications such as acetaminophen (Tylenol) and ibuprofen (Motrin, Advil), unless you are unable to take these medications for medical reasons. Do not operate heavy machinery, drive, or make important decisions while on narcotic pain medications. It is important to wean your narcotic use as soon as possible after surgery. Be careful not to exceed 3,000 mg of acetaminophen in a day from all sources (Tylenol with Codeine, Norco, Vicodin, and Percocet contain acetaminophen).    Keep a record of your narcotic use by recording it in this chart:  Name of medication:   Date Time/dose Time/dose Time/dose Time/dose Time/dose Time/dose                                                                             NOTIFY YOUR SURGEON AT [id number] FOR:   FEVER over 101 F/38.4 C   PAIN not relieved with pain medication ordered   SWELLING, especially if asymmetrical in a bilateral procedure   REDNESS at incision site, especially if it is spreading   BLEEDING that does not  respond to 20 minutes of direct uninterrupted direct pressure   DRAINAGE that is not pink or clear   PROBLEMS WITH DRAINS that are not solved with troubleshooting instructions provided   DIFFICULTY URINATING   CONSTIPATION not relieved by over the counter medications (docusate, senna, bisacodyl, Miralax)  Urgent concerns can be addressed 24 hours a day, 7 days a week. Nonurgent concerns can be addressed during regular business hours M-F 8am-4pm.    Author: Hendricks Limes, MD as of: 05/25/2017  at: 1:09 PM     Due to the sedation medication and or general anesthesia you have been given today, please follow these discharge instructions:    [x]  Do not drive or operate any machinery for 24 hours or the specified time frame that was recommended by your doctor, please refer to post op instructions.  [x]  Do not drink any alcoholic beverages for 24 hours after  your procedure and/or if you are taking a narcotic pain reliever (e.g. Vicodin, Percocet or Tylenol #3).  [x]  Do not make any major decisions or sign contracts for 24 hours.  []  Prescription information provided from [facility].   []  Prescription information given to patient and/or patient representative, prescription not filled at [facility].    Diet: begin with liquids, advance as tolerated.    Your last pain medication was given to you at: Tylenol at 1145AM, Toradol (IV Ibuprofen) at 1PM    Your next dose of pain medication is due after: Tylneol ok after 545PM, Ibuprofen ok after 700PM, Oxy when needed as ordered.     If unable to reach your doctor at [phone] call 911 for a true emergency or go to your nearest emergency department.    It is our recommendation that you have someone stay with you for 24 hours following your procedure.

## 2017-05-29 ENCOUNTER — Encounter: Payer: Self-pay | Admitting: Plastic Surgery

## 2017-05-31 LAB — SURGICAL PATHOLOGY

## 2017-06-01 ENCOUNTER — Encounter: Payer: Self-pay | Admitting: Plastic Surgery

## 2019-01-26 ENCOUNTER — Ambulatory Visit
Admission: AD | Admit: 2019-01-26 | Discharge: 2019-01-26 | Disposition: A | Discharge status: Home / Self Care | Payer: Medicaid (Managed Care) | Source: Ambulatory Visit | Attending: Family | Admitting: Family

## 2019-01-26 DIAGNOSIS — Z20828 Contact with and (suspected) exposure to other viral communicable diseases: Secondary | ICD-10-CM | POA: Insufficient documentation

## 2019-01-26 NOTE — ED Notes (Signed)
Patient presenting to Urgent Care for testing only. COVID-19 test ordered by Urgent Care Provider     Patient occupation: None of the above    Does this patient currently have symptoms concerning for COVID-19?: No     What is the reason for testing?: contact with covid positive person       NP swab obtained and sent for analysis.

## 2019-01-28 LAB — COVID-19 PCR

## 2019-01-28 LAB — COVID-19 NAAT (PCR): COVID-19 NAAT (PCR): NEGATIVE

## 2023-02-08 IMAGING — CT CTA CORONARY
2 of 5 series · 13 of 20 positions shown, 15 images · IV contrast (ISOVUE 300)
Comparison: Count of known CT and Cardiac Nuclear Medicine studies performed in the previous 
12 months = 0,

________________________________________________________________________________________________ 
CTA CORONARY, 02/08/2023 [DATE]: 
CLINICAL INDICATION:  Atherosclerotic Heart Disease Of Native Coronary Artery 
With 
A search for DICOM formatted images was conducted for prior CT imaging studies 
completed at a non-affiliated media free facility.
TECHNIQUE: The region of interest was scanned with contrast on a high resolution 
CT scanner using dose reduction techniques. 3D renderings were reconstructed on 
an independent rotation. 60 mL of Isovue 370 MDV were administered per protocol. 
Images were performed and reconstructed using ECG gating. To reduce the risk of 
nephrotoxicity and to adequately monitor the patient, the patient was monitored 
for over 30 minutes post procedure with 250 mL of normal saline administered 
intravenously. 
CORONARY ARTERY EVALUATION: 
Dominance: The patient is right dominant. 
Left Main: No plaque or stenosis. 
Left Anterior Descending: Calcified plaquing in the mid left anterior descending 
coronary with up to 20% stenosis 
Left Circumflex: Plaquing in the proximal left circumflex coronary artery with 
30-49% stenosis. 
Right Coronary: Plaquing in the distal right coronary artery coronary artery 
with 30-49% stenosis. 
CARDIAC ANATOMY: No chamber enlargement or hypertrophy.  No evidence of 
hypoperfusion or infarct. 
AORTA/GREAT VESSELS: No aneurysm or dissection. 
LUNGS/ PLEURA: Mild dependent atelectasis in the lower lobes. No effusion. 
MEDIASTINUM: No mass. 
OSSEOUS STRUCTURES: No acute fracture or destructive lesion.

[Series 7: multi 30-75% soft · axial · 0.39mm/px · z∈[-247,-157]mm · 6 of 1440 slices shown, 8 images]
[im 206/1440  vessel]
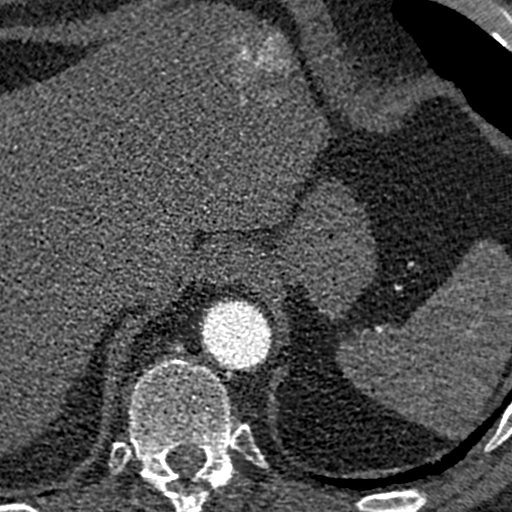
[im 206/1440  lung]
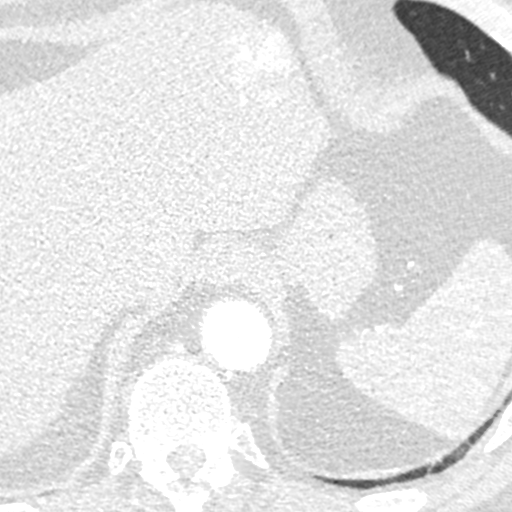
[im 412/1440  vessel]
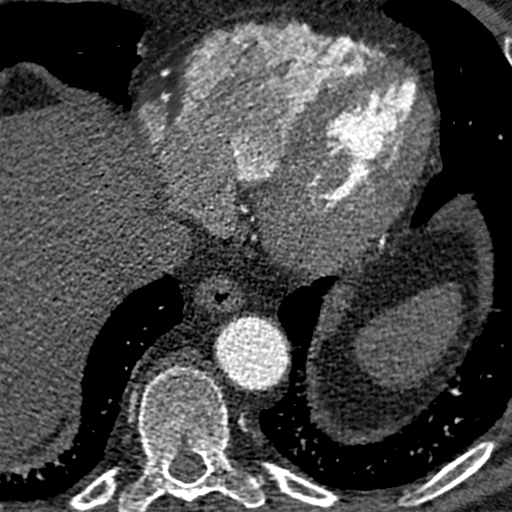
[im 617/1440  vessel]
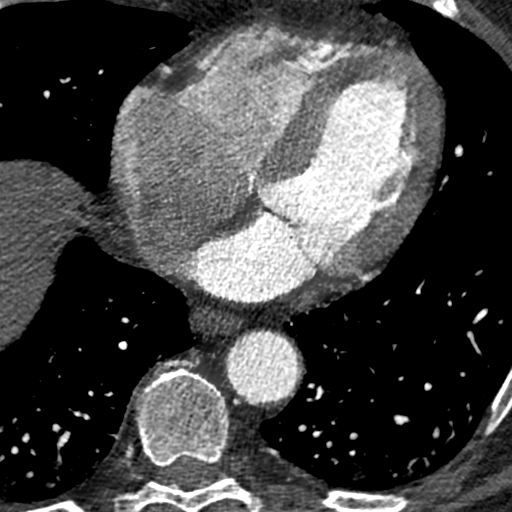
[im 823/1440  vessel]
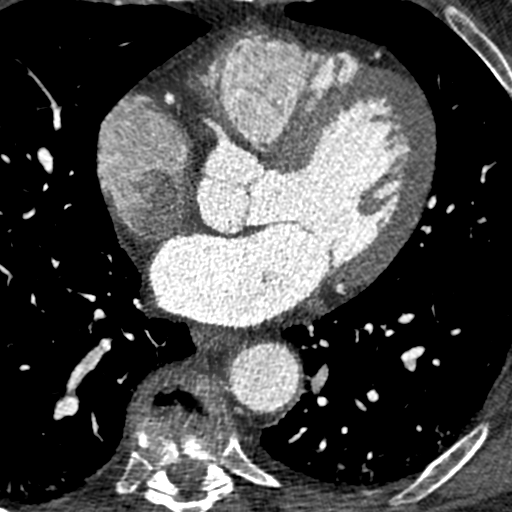
[im 1028/1440  vessel]
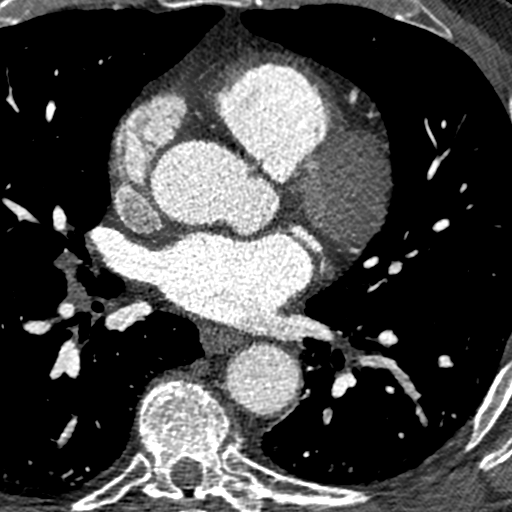
[im 1028/1440  lung]
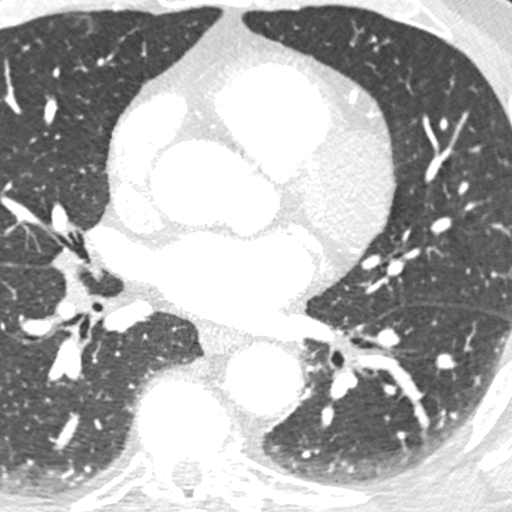
[im 1234/1440  vessel]
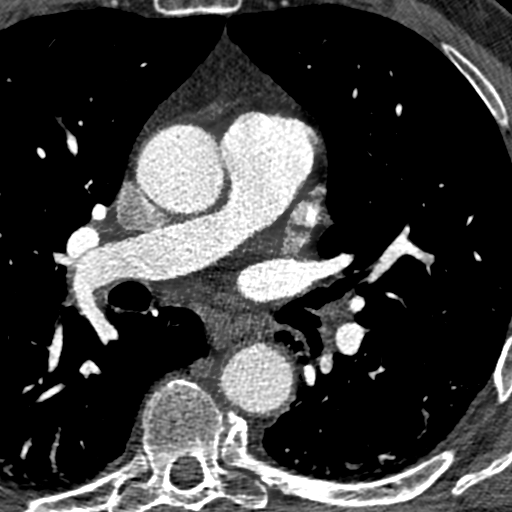

[Series 8: multi 30-80% sharp · axial · 0.39mm/px · z∈[-249,-155]mm · 7 of 1440 slices shown]
[im 180/1440  lung]
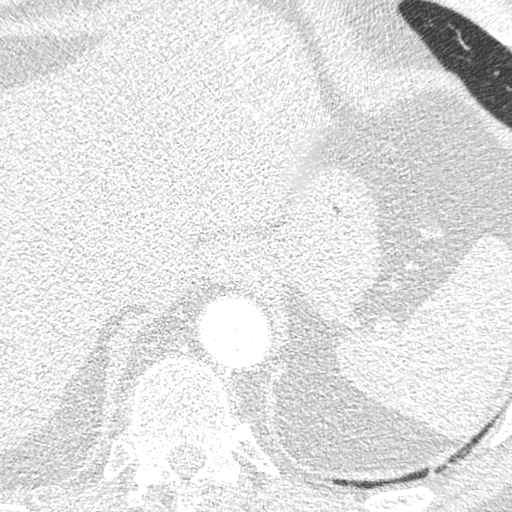
[im 360/1440  lung]
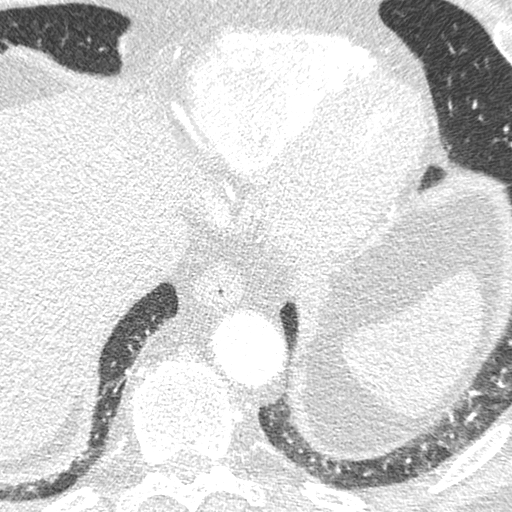
[im 540/1440  lung]
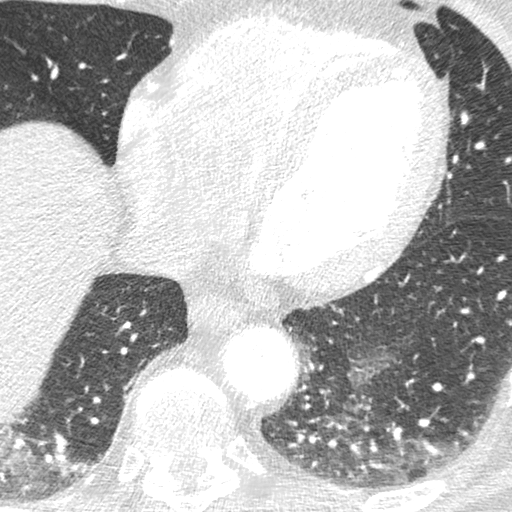
[im 720/1440  lung]
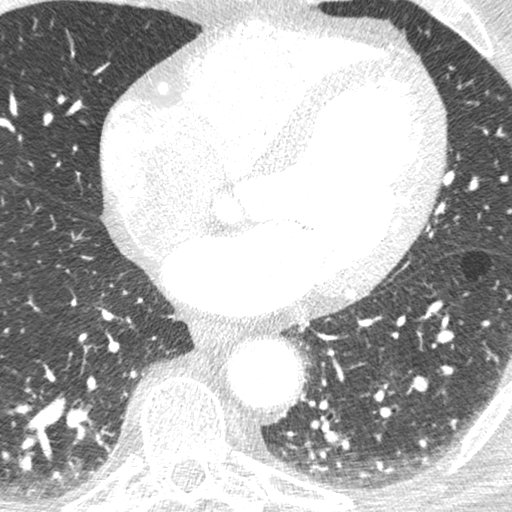
[im 900/1440  lung]
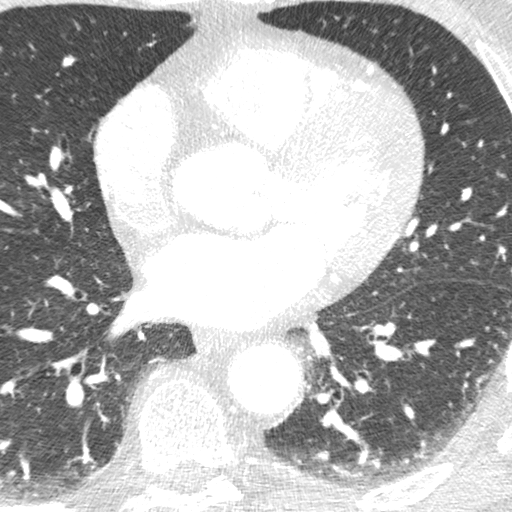
[im 1080/1440  lung]
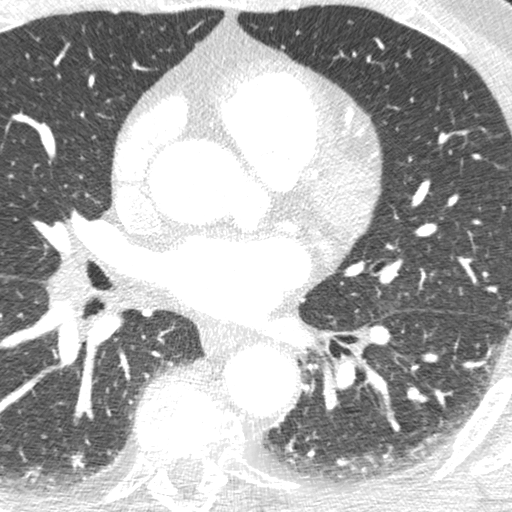
[im 1260/1440  lung]
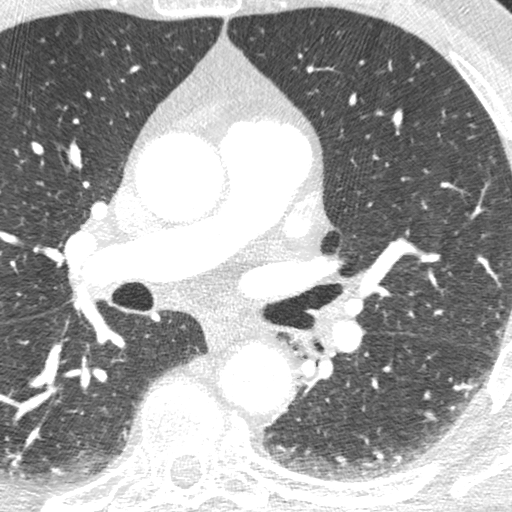

[13 of 20 positions shown; findings below may reference images not displayed]

IMPRESSION: 1. Normal myocardial morphology. 
2. Calcified plaquing in the mid left anterior descending coronary with up to 
20% stenosis 
Left Circumflex: Plaquing in the proximal left circumflex coronary artery with 
30-49% stenosis. 
Right Coronary: Plaquing in the distal right coronary artery coronary artery 
with 30-49% stenosis. 
3. No significant extra-cardiac findings. 
CAD RADS: 
CAD-RADS 2    25-49%   Mild non-obstructive 
RADIATION DOSE REDUCTION: All CT scans are performed using radiation dose 
reduction techniques, when applicable.  Technical factors are evaluated and 
adjusted to ensure appropriate moderation of exposure.  Automated dose 
management technology is applied to adjust the radiation doses to minimize 
exposure while achieving diagnostic quality images.

## 2023-03-16 IMAGING — MR MRI ABDOMEN W/WO CONTRAST
15 of 22 series · 30 of 48 positions shown · IV contrast (gadavist)
Comparison: None

________________________________________________________________________________________________ 
MRI ABDOMEN W/WO CONTRAST, 03/16/2023 [DATE]: 
CLINICAL INDICATION: Fatty (change of) liver, not elsewhere classified. 
Hepatomegaly, not elsewhere classified
TECHNIQUE: Multiplanar, multiecho position MR images of the abdomen were 
performed without and with intravenous enhancement. 8 mL of Gadavist were 
injected intravenously by hand. 2 mL of Gadavist discarded. Patient was scanned 
on a 1.5T magnet.

[Series 101: survey-head 1st · axial · 15.0mm · 1.76mm/px · z∈[-50,+224]mm · 2 of 15 slices shown]
[im 1/15]
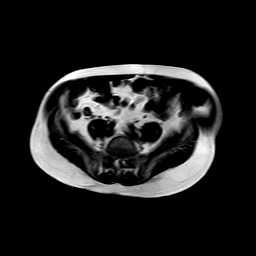
[im 15/15]
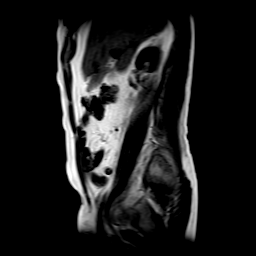

[Series 201: T2 · coronal · 5.0mm · 0.74mm/px · 2 of 34 slices shown]
[im 1/34]
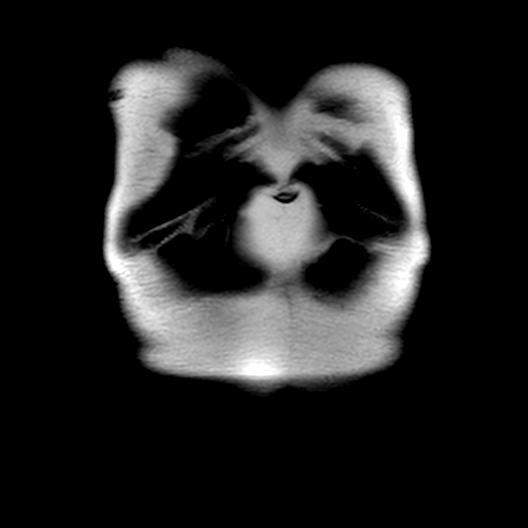
[im 34/34]
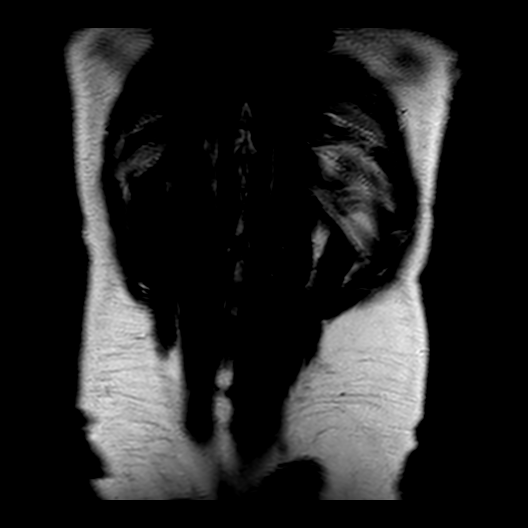

[Series 302: sout of phase · axial · 6.0mm · 1.14mm/px · z∈[+10,+255]mm · 2 of 36 slices shown]
[im 1/36]
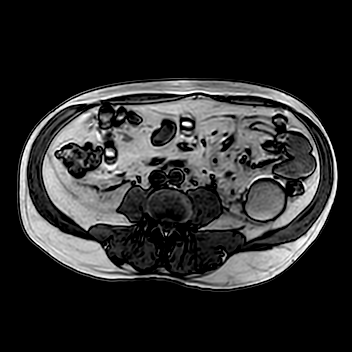
[im 36/36]
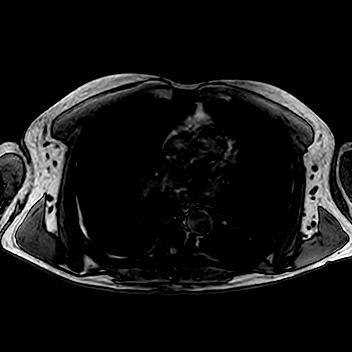

[Series 303: sin phase · axial · 6.0mm · 1.14mm/px · z∈[+10,+255]mm · 2 of 36 slices shown]
[im 1/36]
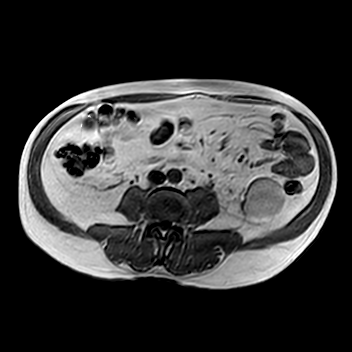
[im 36/36]
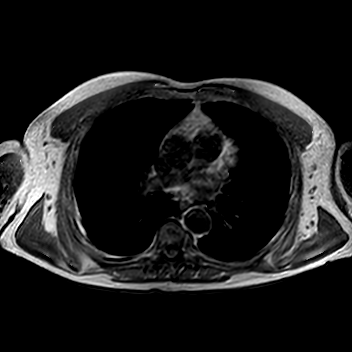

[Series 401: t2_ax_mvxd_hr_rt · axial · 5.0mm · 0.74mm/px · 1 of 40 slices shown]
[im 1/40]
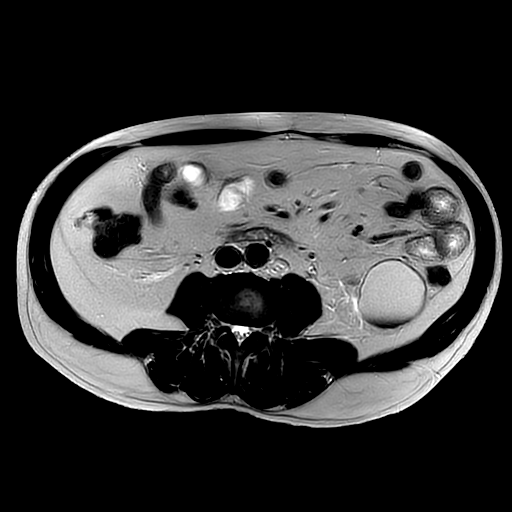

[Series 501: t2_spair mvxd_rt_fast · axial · 5.0mm · 0.85mm/px · 1 of 40 slices shown]
[im 1/40]
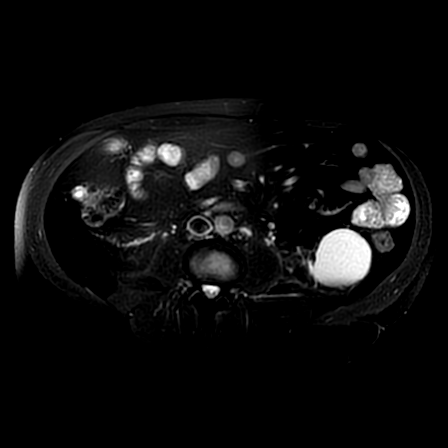

[Series 602: sbo · axial · 5.0mm · 1.74mm/px · 1 of 44 slices shown]
[im 1/44]
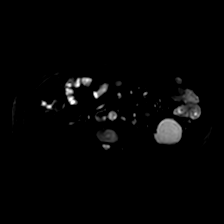

[Series 603: (id) · axial · 5.0mm · 1.74mm/px · 1 of 44 slices shown]
[im 1/44]
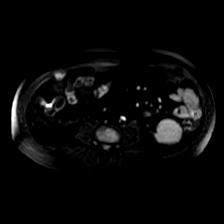

[Series 604: dadc 600 · axial · 5.0mm · 1.74mm/px · 1 of 44 slices shown]
[im 1/44]
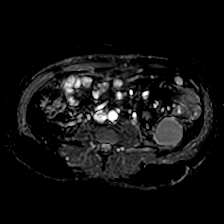

[Series 902: DIXON · axial · 4.0mm · 0.86mm/px · z∈[+14,+252]mm · 3 of 120 slices shown (1 of 5)]
[im 1/120]
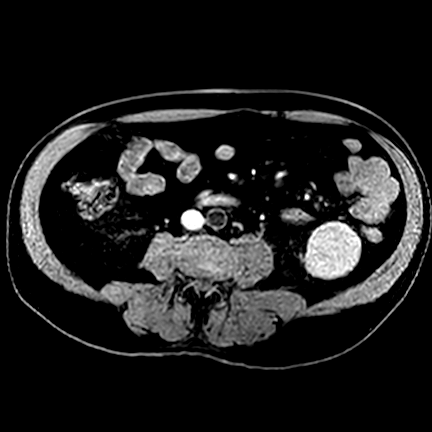
[im 60/120]
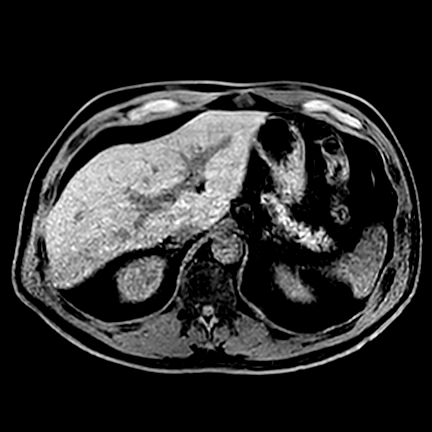
[im 120/120]
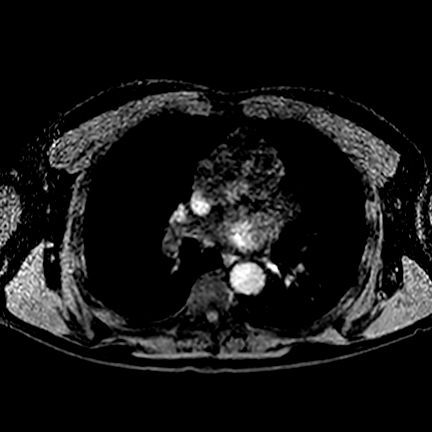

[Series 903: DIXON · axial · 4.0mm · 0.86mm/px · z∈[+14,+252]mm · 3 of 120 slices shown (2 of 5)]
[im 1/120]
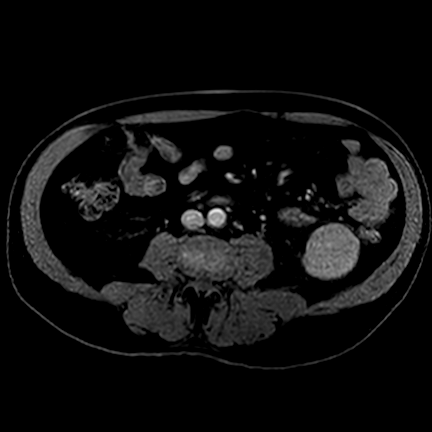
[im 60/120]
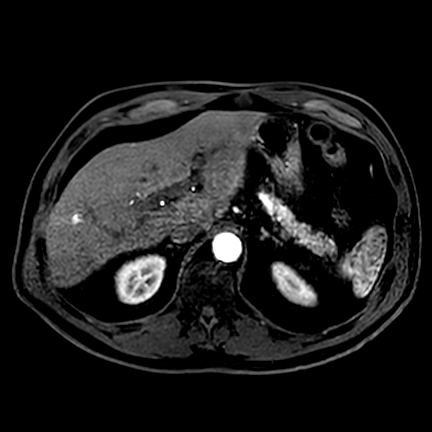
[im 120/120]
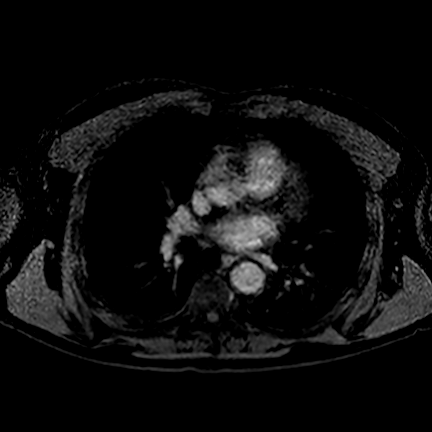

[Series 904: DIXON · axial · 4.0mm · 0.86mm/px · z∈[+14,+252]mm · 3 of 120 slices shown (3 of 5)]
[im 1/120]
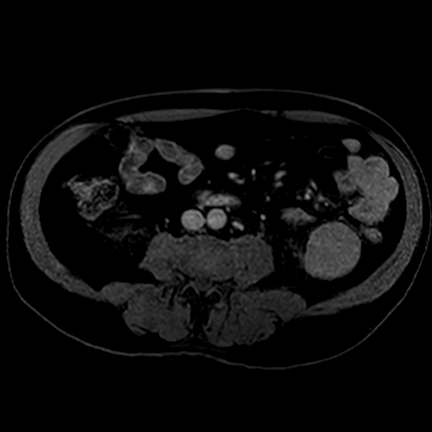
[im 60/120]
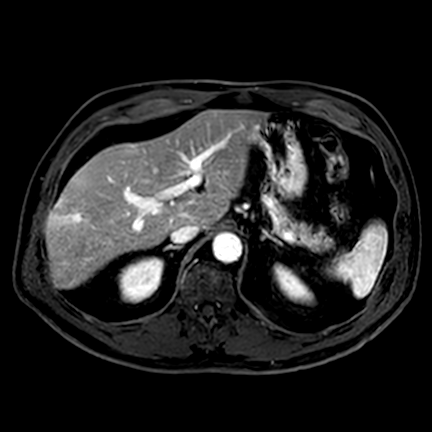
[im 120/120]
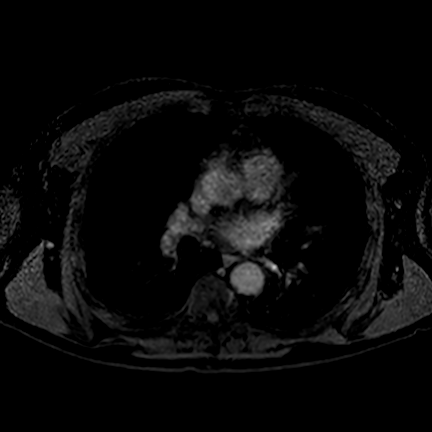

[Series 905: DIXON · axial · 4.0mm · 0.86mm/px · z∈[+14,+252]mm · 3 of 120 slices shown (4 of 5)]
[im 1/120]
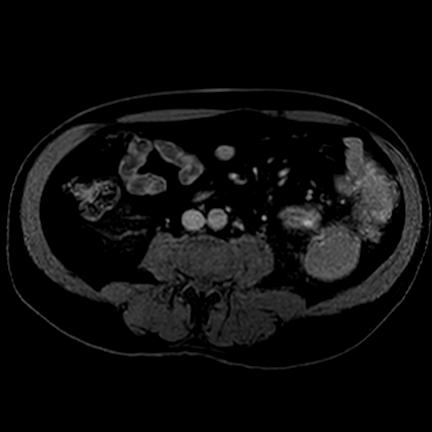
[im 60/120]
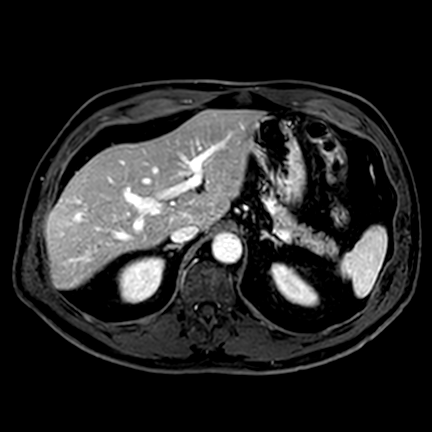
[im 120/120]
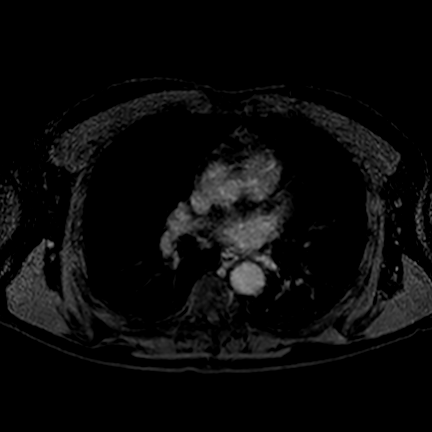

[Series 906: DIXON · axial · 4.0mm · 0.86mm/px · z∈[+14,+252]mm · 3 of 120 slices shown (5 of 5)]
[im 1/120]
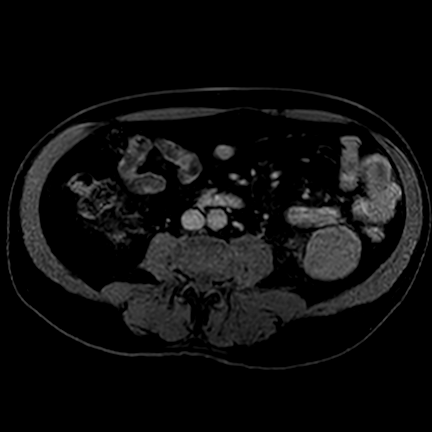
[im 60/120]
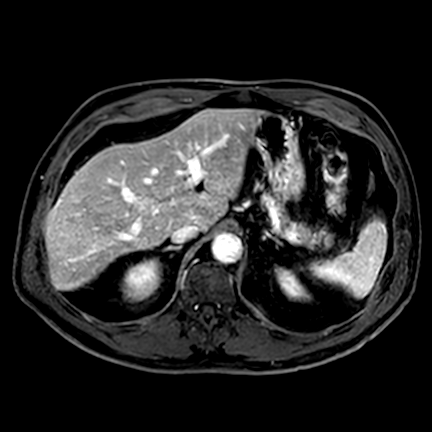
[im 120/120]
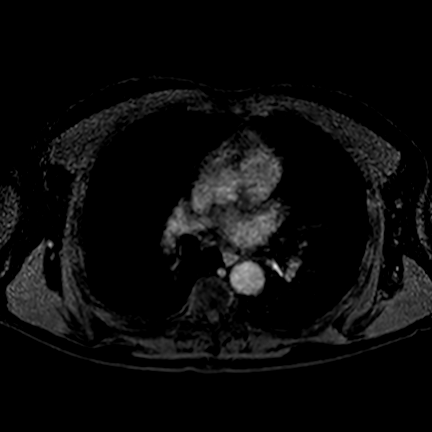

[Series 907: DIXON post-contrast · axial · 4.0mm · 0.86mm/px · z∈[+14,+132]mm · 2 of 120 slices shown]
[im 1/120]
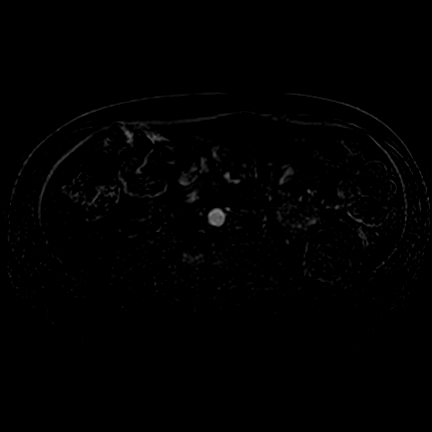
[im 60/120]
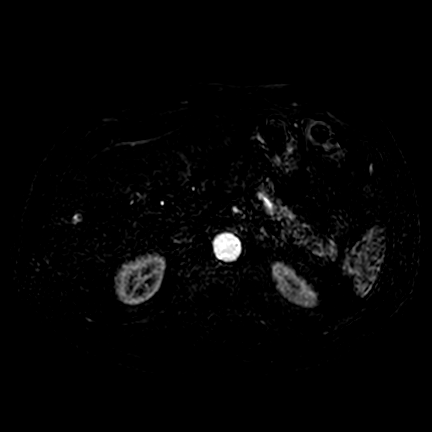

[30 of 48 positions shown; findings below may reference images not displayed]

FINDINGS: Greater than 5 separate hemangiomas seen scattered throughout the 
liver. The largest posteriorly in segment 7 on T2 axial image 29 measures
cm. 
Largest left lobe segment 2 image 26 measuring 2.5 cm. No suspicious mass or 
biliary dilatation. Gallbladder is unremarkable in appearance with cystic 
changes in the fundus thought to be consistent with mild changes of 
adenomyomatosis. 
Spleen, pancreas and adrenal glands unremarkable in appearance. No mass or 
abnormal enhancement. 
Large what appears be proteinaceous cyst off the inferior pole the left kidney. 
Has a fluid/fluid level within it maximum dimension of 5 cm. Does not enhance 
post contrast. No hydronephrosis. No suspicious mass identified. 
Degenerative changes. No adenopathy identified.
IMPRESSION: 5+ hemangioma scattered throughout the liver largest described above. Otherwise 
no acute abnormality seen the liver. 
Suspected changes of adenomyomatosis involving the fundus of the gallbladder. 
Large Bosniak 2 cyst present off the inferior pole of the left kidney.

## 8387-01-07 DEATH — deceased
